# Patient Record
Sex: Female | Born: 1970 | Race: White | Hispanic: No | Smoking: Never smoker
Health system: Southern US, Community
[De-identification: ages and names within clinical notes are randomized; demographics above are authoritative.]

## PROBLEM LIST (undated history)

## (undated) DIAGNOSIS — I10 Essential (primary) hypertension: Secondary | ICD-10-CM

## (undated) DIAGNOSIS — C801 Malignant (primary) neoplasm, unspecified: Secondary | ICD-10-CM

## (undated) DIAGNOSIS — R569 Unspecified convulsions: Secondary | ICD-10-CM

## (undated) DIAGNOSIS — F419 Anxiety disorder, unspecified: Secondary | ICD-10-CM

## (undated) DIAGNOSIS — K259 Gastric ulcer, unspecified as acute or chronic, without hemorrhage or perforation: Secondary | ICD-10-CM

## (undated) DIAGNOSIS — M1990 Unspecified osteoarthritis, unspecified site: Secondary | ICD-10-CM

## (undated) DIAGNOSIS — F32A Depression, unspecified: Secondary | ICD-10-CM

## (undated) DIAGNOSIS — F329 Major depressive disorder, single episode, unspecified: Secondary | ICD-10-CM

## (undated) HISTORY — PX: CHOLECYSTECTOMY: SHX55

## (undated) HISTORY — PX: KNEE SURGERY: SHX244

## (undated) HISTORY — PX: EYE SURGERY: SHX253

---

## 2001-04-17 ENCOUNTER — Other Ambulatory Visit: Admission: RE | Admit: 2001-04-17 | Discharge: 2001-04-17 | Payer: Self-pay | Admitting: Obstetrics and Gynecology

## 2001-11-02 ENCOUNTER — Emergency Department (HOSPITAL_COMMUNITY): Admission: EM | Admit: 2001-11-02 | Discharge: 2001-11-02 | Payer: Self-pay | Admitting: Emergency Medicine

## 2004-07-03 ENCOUNTER — Emergency Department (HOSPITAL_COMMUNITY): Admission: EM | Admit: 2004-07-03 | Discharge: 2004-07-03 | Payer: Self-pay | Admitting: Emergency Medicine

## 2005-06-14 ENCOUNTER — Observation Stay (HOSPITAL_COMMUNITY): Admission: EM | Admit: 2005-06-14 | Discharge: 2005-06-15 | Payer: Self-pay | Admitting: Emergency Medicine

## 2005-07-20 ENCOUNTER — Ambulatory Visit (HOSPITAL_COMMUNITY): Admission: RE | Admit: 2005-07-20 | Discharge: 2005-07-20 | Payer: Self-pay | Admitting: Obstetrics and Gynecology

## 2006-12-05 ENCOUNTER — Other Ambulatory Visit: Admission: RE | Admit: 2006-12-05 | Discharge: 2006-12-05 | Payer: Self-pay | Admitting: Unknown Physician Specialty

## 2006-12-05 ENCOUNTER — Encounter (INDEPENDENT_AMBULATORY_CARE_PROVIDER_SITE_OTHER): Payer: Self-pay | Admitting: *Deleted

## 2007-07-03 ENCOUNTER — Other Ambulatory Visit: Admission: RE | Admit: 2007-07-03 | Discharge: 2007-07-03 | Payer: Self-pay | Admitting: Unknown Physician Specialty

## 2007-07-03 ENCOUNTER — Encounter (INDEPENDENT_AMBULATORY_CARE_PROVIDER_SITE_OTHER): Payer: Self-pay | Admitting: Unknown Physician Specialty

## 2007-09-23 ENCOUNTER — Emergency Department (HOSPITAL_COMMUNITY): Admission: EM | Admit: 2007-09-23 | Discharge: 2007-09-23 | Payer: Self-pay | Admitting: Emergency Medicine

## 2007-09-25 ENCOUNTER — Ambulatory Visit (HOSPITAL_COMMUNITY): Admission: RE | Admit: 2007-09-25 | Discharge: 2007-09-25 | Payer: Self-pay | Admitting: Family Medicine

## 2007-10-10 ENCOUNTER — Encounter (HOSPITAL_COMMUNITY): Admission: RE | Admit: 2007-10-10 | Discharge: 2007-11-09 | Payer: Self-pay | Admitting: Family Medicine

## 2007-10-22 ENCOUNTER — Ambulatory Visit (HOSPITAL_COMMUNITY): Admission: RE | Admit: 2007-10-22 | Discharge: 2007-10-22 | Payer: Self-pay | Admitting: General Surgery

## 2007-10-22 ENCOUNTER — Encounter (INDEPENDENT_AMBULATORY_CARE_PROVIDER_SITE_OTHER): Payer: Self-pay | Admitting: General Surgery

## 2008-09-01 ENCOUNTER — Emergency Department (HOSPITAL_COMMUNITY): Admission: EM | Admit: 2008-09-01 | Discharge: 2008-09-01 | Payer: Self-pay | Admitting: Emergency Medicine

## 2008-12-16 ENCOUNTER — Ambulatory Visit (HOSPITAL_COMMUNITY): Admission: RE | Admit: 2008-12-16 | Discharge: 2008-12-16 | Payer: Self-pay | Admitting: Family Medicine

## 2009-03-18 ENCOUNTER — Ambulatory Visit (HOSPITAL_COMMUNITY): Admission: RE | Admit: 2009-03-18 | Discharge: 2009-03-18 | Payer: Self-pay | Admitting: Family Medicine

## 2010-03-15 ENCOUNTER — Other Ambulatory Visit: Admission: RE | Admit: 2010-03-15 | Discharge: 2010-03-15 | Payer: Self-pay | Admitting: Family Medicine

## 2010-05-13 ENCOUNTER — Emergency Department (HOSPITAL_COMMUNITY): Admission: EM | Admit: 2010-05-13 | Discharge: 2010-05-13 | Payer: Self-pay | Admitting: Emergency Medicine

## 2010-05-26 ENCOUNTER — Encounter
Admission: RE | Admit: 2010-05-26 | Discharge: 2010-08-24 | Payer: Self-pay | Source: Home / Self Care | Admitting: Orthopaedic Surgery

## 2010-10-25 ENCOUNTER — Encounter: Payer: Self-pay | Admitting: Family Medicine

## 2011-02-15 NOTE — Op Note (Signed)
[name], [name] NO.:  [id number]   MEDICAL RECORD NO.:  [id number]          PATIENT TYPE:  AMB   LOCATION:  DAY                           FACILITY:  [facility]   PHYSICIAN:  Tilford Pillar, MD      DATE OF BIRTH:  [birth year]   DATE OF PROCEDURE:  10/22/2007  DATE OF DISCHARGE:                               OPERATIVE REPORT   PREOPERATIVE DIAGNOSES:  Cholelithiasis and biliary dyskinesia.   POSTOPERATIVE DIAGNOSES:  Cholelithiasis and biliary dyskinesia.   OPERATIVE PROCEDURE:  Laparoscopic cholecystectomy.   SURGEON:  Tilford Pillar, MD   ANESTHESIA:  General endotracheal anesthesia.  Local anesthetic 1%  Sensorcaine plain.   ESTIMATED BLOOD LOSS:  Less than 100 mL.   SPECIMENS:  Gallbladder.   COMPLICATIONS:  None.   INDICATIONS FOR PROCEDURE:  The patient is a 40 year old female with a  history of epigastric abdominal pain and intermittent bloating  discomfort.  She had been evaluated for actually a left sided pain by  her primary physician which demonstrated suspected UTI but during the  work-up she was also diagnosed with cholelithiasis.  HIDA scan was then  obtained by her primary physician which again demonstrated biliary  dyskinesia.  Based on these findings it was recommended that she would  require a cholecystectomy at some point, sooner rather than later was  anticipated.  The risks, benefits and alternatives of laparoscopic,  possible open cholecystectomy were discussed at length with the patient  including but not limited to the possibility of infection, bleeding,  common bile duct injury, bile leak or small bowel injury as well as the  possibility of intraoperative cardiac or pulmonary episodes.  The  patient's questions and concerns were addressed and the patient was  consented for the planned procedure.   DESCRIPTION OF PROCEDURE:  The patient was taken to the operating room,  placed in a supine position on the operating room table.   General  anesthesia was administered.  Once the patient was asleep, she was  endotracheally intubated by anesthesia.  At this point her abdomen was  prepped and draped in the usual fashion.  A stab incision was created  supraumbilically.  A Kocher clamp was utilized to dissect down to the  anterior abdominal wall fascia, grasped anteriorly, Veress needle was  inserted.  Saline drop test was utilized to confirm anterior peritoneal  placement and a pneumoperitoneum was initiated.  Once sufficient  pneumoperitoneum was obtained, an 11 mm trocar was inserted over a  laparoscope, allowing visualization of the trocar, entering into the  peritoneal cavity.  At this point the inner cannula was removed.  The  laparoscope was reinserted.  There was no evidence of any trocar or  Veress needle placement injury.  At this time there was a thin veil of  adhesions superior blocking view of the right lobe of the liver and this  was in continuity with the falciform ligament.  Good visualization of  the left upper quadrant was possible at this point as there was an area  of very thin adhesions.  The laparoscope was utilized to bluntly dissect  through this, allowing exposure into the right upper quadrant.  At this  point the remaining trocars were placed with an 11 mm trocar in the  epigastrium, a 5 mm trocar in the midline between the two 11 mm trocars  and a 5 mm trocar in the right lateral abdominal wall.  At this point  the laparoscope was reinserted through the epigastric trocar site to  visualize the previously placed umbilical trocar.  This could easily be  visualized.  No evidence of any bowel with any adhesions was noted.  No  evidence of any significant injury or bleeding were noted.  At this time  the laparoscope was reinserted back into the umbilical trocar site and  at this time the patient was placed in the reverse Trendelenburg left  lateral decubitus position for exposure.  The fundus of the  gallbladder  was grasped with the regular grasper, lifted up and over the liver.  At  this point a combination of electrocautery and blunt dissection was  utilized to strip the peritoneal reflection off the infundibulum of the  gallbladder.  This allowed exposure of the cystic duct.  A window was  created behind the cystic duct.  Three endoclips were placed proximally,  one distally and the cystic duct was divided between the two most distal  clips.  Similarly the cystic artery was identified. A window was created  behind this with the [facility] and two endoclips were placed  proximally, one distally and the cystic artery was divided between the  two most distal clips.  At this point electrocautery was utilized to  dissect the gallbladder free from the gallbladder fossa.  Once free it  was placed in the EndoCatch bag which was placed up and over the right  lobe of the liver.  At this point hemostasis was obtained using  electrocautery in the gallbladder fossa and a piece of Surgicel was  placed into the fossa.  At this point the patient was placed back into a  supine position.  Using an endoclose suture passing device to pass a 2-0  Vicryl to the epigastric trocar site.  With this suture in place,  attention was turned back to the umbilical trocar site.  Due to the  amount of adhesions it was not felt to be safe to use the endoclose  suture device to attempt placement.  Therefore at this point the  gallbladder was removed using an intact EndoCatch bag from the  epigastric trocar site.  Some blunt dilatation of the trocar site was  required in order to adequately remove the gallbladder.  The gallbladder  was then placed on the back table and was sent as permanent specimen to  pathology.  At this point the pneumoperitoneum was evacuated.  The local  anesthetic was instilled.  An 0 Vicryl and a UR needle was utilized to  close the fascia at the umbilical trocar site and then a 4-0  Monocryl  was utilized to reapproximate the skin edges at all four trocar sites.  Skin was washed and dried with a moist dry towel.  Benzoin as applied to  all trocar sites.  Half inch Steri-Strips were placed over the incisions  and drapes were removed.  The patient was allowed to come out of general  anesthetic, transferred back to her hospital bed.  She was transferred  to the post anesthetic care unit in stable condition.  At the conclusion  of the procedure all instruments, sponge and needle counts were  correct.  The patient tolerated the procedure well.      Tilford Pillar, MD  Electronically Signed     BZ/MEDQ  D:  10/22/2007  T:  10/22/2007  Job:  [id number]   cc:   Tilford Pillar, MD  Fax: [phone]   Dr. Metta Clines
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
## 2011-02-18 NOTE — H&P (Signed)
[name], [name] NO.:  [id number]   MEDICAL RECORD NO.:  [id number]          PATIENT TYPE:  EMS   LOCATION:  ED                            FACILITY:  [facility]   PHYSICIAN:  Kingsley Callander. Ouida Sills, MD       DATE OF BIRTH:  [birth year]   DATE OF ADMISSION:  06/14/2005  DATE OF DISCHARGE:  LH                                HISTORY & PHYSICAL   CHIEF COMPLAINT:  Seizure.   HISTORY OF PRESENT ILLNESS:  This patient is a 40 year old white female who  presented to the emergency room by ambulance after suffering what was felt  to have been a seizure at home.  The patient had been working around her  home.  She had been feeling in her usual state of health.  Her boyfriend  witnessed her to stare off and point and then witnessed her fall and begin  experiencing convulsions.  She was unresponsive.  She had generalized  jerking type movements.  She bit her tongue.  She did not experience  incontinence.  There has been no previous seizure history.  She does have a  family history of seizures in her sister and her mother.  She denies any  prior head trauma, alcohol abuse, or change in medications.   PAST MEDICAL HISTORY:  1.  Detached retina 11 years ago.  2.  Migraine headaches.  3  Abortion 2 months ago.   MEDICATIONS:  1.  Prozac 40 mg daily.  2.  Darvocet p.r.n.  3.  Xanax 1 mg p.r.n. (on average about 15 per month).  4.  Depo-Provera.   ALLERGIES:  None.   SOCIAL HISTORY:  She does not smoke or use drugs.  She does not abuse  alcohol. She works at [employer].   FAMILY HISTORY:  As above.   REVIEW OF SYSTEMS:  She had experienced a mild right-sided headache prior to  this event.  No vomiting, change in bowel habits, chest pain, or difficulty  breathing.   PHYSICAL EXAMINATION:  VITAL SIGNS:  Temperature 98.4, blood pressure  114/66, pulse 80, respirations 22, oxygen saturation 100%.  GENERAL:  Alert and oriented.  HEENT:  She has abrasions on the right side of her face  an head.  She has  periorbital swelling and bruising on the right.  Pupils are equal, round,  and reactive.  Extraocular movements are intact.  Pharynx is moist.  She has  a small area of swelling and superficial laceration on the left side of the  tongue.  NECK:  Supple.  No thyromegaly or lymphadenopathy.  LUNGS:  Clear.  HEART:  Regular with no murmurs.  ABDOMEN:  Nontender.  No hepatosplenomegaly.  EXTREMITIES:  She has superficial abrasions on her right shoulder and right  knee.  No clubbing, cyanosis, or edema.  NEUROLOGIC:  She is alert and oriented.  Her speech is normal.  Her face is  symmetric. Her strength is normal in the upper and lower extremities.  Her  gait has been normal.   LABORATORY DATA:  White count 4.1, hemoglobin 12.3, platelets 257,000.  Urine pregnancy  test is negative.  Sodium 137, potassium 3.0, chloride 103,  bicarb 25, glucose 95, BUN 7, creatinine 0.6, calcium 8.5, albumin 3.7, SGOT  20.  A drug screen was positive for benzodiazepines only.  Her urinalysis  revealed 11-20 red cells and 3-6 white cells.  A CT scan of the head was  negative.  A maxillofacial CT scan revealed soft tissue swelling in the area  of the right globe, but no fracture.   IMPRESSION:  1.  New onset of seizure disorder.  She is being hospitalized for      observation.  Will consult Dr. Gerilyn Pilgrim; and will obtain an EEG.  Will      defer the decision regarding anticonvulsant therapy for now, but she      will be treated with Ativan if needed.  2.  Hypokalemia.  We will supplement orally.  3.  Possible urinary tract infection.  We will obtain a urine culture.  4.  History of anxiety/depression continue Prozac and Xanax.  5.  History of migraines and detached retina.      Kingsley Callander. Ouida Sills, MD  Electronically Signed     ROF/MEDQ  D:  06/14/2005  T:  06/14/2005  Job:  [id number]   cc:   Mila Homer. Sudie Bailey, M.D.  [address] [place], [place] [address]  Fax: [phone]
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
## 2011-02-18 NOTE — Discharge Summary (Signed)
Theresa, Benson NO.:  [id number]   MEDICAL RECORD NO.:  [id number]          PATIENT TYPE:  OBV   LOCATION:  A219                          FACILITY:  [facility]   PHYSICIAN:  Mila Homer. Sudie Bailey, M.D.DATE OF BIRTH:  25-Jun-1971   DATE OF ADMISSION:  06/14/2005  DATE OF DISCHARGE:  09/13/2006LH                                 DISCHARGE SUMMARY   HISTORY OF PRESENT ILLNESS:  This 40 year old was admitted to the hospital  last night after what appeared to be a seizure.  She had a benign course  overnight into the early afternoon.  She had no further seizures.   Blood work was essentially unremarkable except for some sign of possible  urinary tract infection with increased white cells in the urine and a  potassium and a met 7 of 3.0.   Her examination was unremarkable except for some abrasions and bruising in  the right cheek.  Eye exam appeared to be normal as was the oral cavity  except for a chipped lower tooth.   PLAN:  EEG was to be done in the hospital, but was unavailable.  Dr. Tenny Craw  could only see her later, and since she was stable without further problems,  was elected to discharge her home to be seen by the neurologist, Dr. Beryle Beams, at 9:40 on June 16, 2005, in his office in [place].  He was to  arrange EEG for her at that time.  She was also given a script for K-Ciel 20  mEq daily (#30, no refills) and fluoxetine higher dose than she has been on,  40 mg daily, #30, 2 refills.   FINAL DISCHARGE DIAGNOSES:  1.  Possible epilepsy.  2.  Hypokalemia.  3.  Depression.      Mila Homer. Sudie Bailey, M.D.  Electronically Signed     SDK/MEDQ  D:  06/15/2005  T:  06/16/2005  Job:  [id number]   cc:   Darleen Crocker A. Gerilyn Pilgrim, M.D.  Fax: [phone]

## 2011-06-23 LAB — BASIC METABOLIC PANEL
BUN: 9
CO2: 22
Calcium: 8.9
Chloride: 108
Creatinine, Ser: 0.71
GFR calc Af Amer: >60
GFR calc non Af Amer: >60
Glucose, Bld: 129 — ABNORMAL HIGH
Potassium: 3.8
Sodium: 136

## 2011-06-23 LAB — CBC
HCT: 39.4
Hemoglobin: 13.2
MCHC: 33.5
MCV: 87.3
Platelets: 298
RBC: 4.51
RDW: 14.1
WBC: 6.3

## 2011-06-23 LAB — HCG, QUANTITATIVE, PREGNANCY: hCG, Beta Chain, Quant, S: <2

## 2011-07-05 LAB — PREGNANCY, URINE: Preg Test, Ur: NEGATIVE

## 2011-07-08 LAB — URINALYSIS, ROUTINE W REFLEX MICROSCOPIC
Glucose, UA: NEGATIVE
Ketones, ur: NEGATIVE
Nitrite: POSITIVE — AB
Protein, ur: 100 — AB
Specific Gravity, Urine: 1.025
Urobilinogen, UA: 2 — ABNORMAL HIGH
pH: 6

## 2011-07-08 LAB — URINE MICROSCOPIC-ADD ON

## 2011-07-08 LAB — URINE CULTURE: Colony Count: >=100000

## 2011-07-08 LAB — GC/CHLAMYDIA PROBE AMP, GENITAL
Chlamydia, DNA Probe: NEGATIVE
GC Probe Amp, Genital: NEGATIVE

## 2011-07-08 LAB — WET PREP, GENITAL
Clue Cells Wet Prep HPF POC: NONE SEEN
Trich, Wet Prep: NONE SEEN
Yeast Wet Prep HPF POC: NONE SEEN

## 2011-07-08 LAB — PREGNANCY, URINE: Preg Test, Ur: NEGATIVE

## 2011-08-05 ENCOUNTER — Emergency Department (HOSPITAL_COMMUNITY)
Admission: EM | Admit: 2011-08-05 | Discharge: 2011-08-05 | Disposition: A | Discharge status: Home / Self Care | Payer: Self-pay | Attending: Emergency Medicine | Admitting: Emergency Medicine

## 2011-08-05 ENCOUNTER — Encounter: Payer: Self-pay | Admitting: *Deleted

## 2011-08-05 DIAGNOSIS — J329 Chronic sinusitis, unspecified: Secondary | ICD-10-CM | POA: Insufficient documentation

## 2011-08-05 DIAGNOSIS — J40 Bronchitis, not specified as acute or chronic: Secondary | ICD-10-CM | POA: Insufficient documentation

## 2011-08-05 MED ORDER — PROMETHAZINE-CODEINE 6.25-10 MG/5ML PO SYRP: 5.0000 mL | ORAL_SOLUTION | Freq: Four times a day (QID) | ORAL | Status: AC | PRN

## 2011-08-05 MED ORDER — PREDNISONE 20 MG PO TABS
60.0000 mg | ORAL_TABLET | Freq: Once | ORAL | Status: AC
  Administered 2011-08-05: 60 mg via ORAL
  Filled 2011-08-05: qty 3

## 2011-08-05 MED ORDER — PREDNISONE 10 MG PO TABS: ORAL_TABLET | ORAL | Status: DC

## 2011-08-05 MED ORDER — HYDROCOD POLST-CHLORPHEN POLST 10-8 MG/5ML PO LQCR
5.0000 mL | Freq: Once | ORAL | Status: AC
  Administered 2011-08-05: 5 mL via ORAL
  Filled 2011-08-05: qty 5

## 2011-08-05 MED ORDER — PENICILLIN V POTASSIUM 500 MG PO TABS: ORAL_TABLET | ORAL | Status: DC

## 2011-08-05 MED ORDER — PENICILLIN V POTASSIUM 250 MG PO TABS
500.0000 mg | ORAL_TABLET | Freq: Once | ORAL | Status: AC
  Administered 2011-08-05: 500 mg via ORAL
  Filled 2011-08-05: qty 2

## 2011-08-05 MED ORDER — FEXOFENADINE-PSEUDOEPHED ER 60-120 MG PO TB12: 1.0000 | ORAL_TABLET | Freq: Two times a day (BID) | ORAL | Status: DC

## 2011-08-05 NOTE — ED Provider Notes (Signed)
History     CSN: [id number] Arrival date & time: 08/05/2011 11:11 AM   First MD Initiated Contact with Patient 08/05/11 1216      Chief Complaint  Patient presents with  . Cough  . Nasal Congestion  . Sore Throat  . Generalized Body Aches    (Consider location/radiation/quality/duration/timing/severity/associated sxs/prior treatment) Patient is a 40 y.o. female presenting with cough and pharyngitis. The history is provided by the patient.  Cough This is a new problem. The current episode started 2 days ago. The problem occurs every few minutes. The problem has not changed since onset.The cough is non-productive. The maximum temperature recorded prior to her arrival was 100 to 100.9 F. Associated symptoms include chills, sweats, headaches, rhinorrhea, sore throat and myalgias. Pertinent negatives include no chest pain, no shortness of breath and no wheezing. She has tried cough syrup for the symptoms. The treatment provided no relief. She is not a smoker. Her past medical history is significant for bronchitis.  Sore Throat Associated symptoms include chills, coughing, headaches, myalgias and a sore throat. Pertinent negatives include no abdominal pain, arthralgias, chest pain or neck pain.    History reviewed. No pertinent past medical history.  History reviewed. No pertinent past surgical history.  History reviewed. No pertinent family history.  History  Substance Use Topics  . Smoking status: Never Smoker   . Smokeless tobacco: Not on file  . Alcohol Use: No    OB History    Grav Para Term Preterm Abortions TAB SAB Ect Mult Living                  Review of Systems  Constitutional: Positive for chills. Negative for activity change.       All ROS Neg except as noted in HPI  HENT: Positive for sore throat and rhinorrhea. Negative for nosebleeds and neck pain.   Eyes: Negative for photophobia and discharge.  Respiratory: Positive for cough. Negative for shortness of  breath and wheezing.   Cardiovascular: Negative for chest pain and palpitations.  Gastrointestinal: Negative for abdominal pain and blood in stool.  Genitourinary: Negative for dysuria, frequency and hematuria.  Musculoskeletal: Positive for myalgias. Negative for back pain and arthralgias.  Skin: Negative.   Neurological: Positive for headaches. Negative for dizziness, seizures and speech difficulty.  Psychiatric/Behavioral: Negative for hallucinations and confusion.    Allergies  Review of patient's allergies indicates no known allergies.  Home Medications   Current Outpatient Rx  Name Route Sig Dispense Refill  . ARIPIPRAZOLE 15 MG PO TABS Oral Take 15 mg by mouth daily.      Marland Kitchen CLONAZEPAM 1 MG PO TABS Oral Take 1 mg by mouth 2 (two) times daily.      Marland Kitchen DEXTROMETHORPHAN-GUAIFENESIN 30-600 MG PO TB12 Oral Take 1 tablet by mouth every 12 (twelve) hours.      Marland Kitchen PAROXETINE HCL 30 MG PO TABS Oral Take 30 mg by mouth every morning.        BP 113/79  Pulse 97  Temp(Src) 99.2 F (37.3 C) (Oral)  Resp 18  Ht 5\' 6"  (1.676 m)  Wt 265 lb 8 oz (120.43 kg)  BMI 42.85 kg/m2  SpO2 100%  Physical Exam  Nursing note and vitals reviewed. Constitutional: She is oriented to person, place, and time. She appears well-developed and well-nourished.  Non-toxic appearance.  HENT:  Head: Normocephalic.  Right Ear: Tympanic membrane and external ear normal.  Left Ear: Tympanic membrane and external ear normal.  Nasal congestion noted. Mod increase redness of the posterior pharynx. No exudate. Airway patent  Eyes: EOM and lids are normal. Pupils are equal, round, and reactive to light.  Neck: Normal range of motion. Neck supple. Carotid bruit is not present.  Cardiovascular: Normal rate, regular rhythm, normal heart sounds, intact distal pulses and normal pulses.   Pulmonary/Chest: No respiratory distress. She has rhonchi.       Anterior chest wall soreness to palpation. Course breath sounds.    Abdominal: Soft. Bowel sounds are normal. There is no tenderness. There is no guarding.  Musculoskeletal: Normal range of motion.  Lymphadenopathy:       Head (right side): No submandibular adenopathy present.       Head (left side): No submandibular adenopathy present.    She has no cervical adenopathy.  Neurological: She is alert and oriented to person, place, and time. She has normal strength. No cranial nerve deficit or sensory deficit.  Skin: Skin is warm and dry.  Psychiatric: She has a normal mood and affect. Her speech is normal.    ED Course  Procedures (including critical care time)  Labs Reviewed - No data to display No results found.  Dx: 1. Sinusitis   2. Bronchitis   MDM  I have reviewed nursing notes, vital signs, and all appropriate lab and imaging results for this patient.        Kathie Dike, [place] 08/05/11 [phone]

## 2011-08-05 NOTE — ED Notes (Signed)
Pt a/ox4. Resp even and unlabored. NAD at this time. D/C instructions and rx x4 reviewed with pt. Pt verbalized understanding. Pt ambulated to POV with steady gate. Husband with pt to transport home.

## 2011-08-05 NOTE — ED Notes (Signed)
Pt medicated per orders.

## 2011-08-05 NOTE — ED Notes (Signed)
Pt c/o cough congestion sore throat and body aches that started 2 days ago.

## 2011-08-07 NOTE — ED Provider Notes (Signed)
Medical screening examination/treatment/procedure(s) were performed by non-physician practitioner and as supervising physician I was immediately available for consultation/collaboration.  Donnetta Hutching, MD 08/07/11 [phone]

## 2011-08-21 ENCOUNTER — Encounter (HOSPITAL_COMMUNITY): Payer: Self-pay

## 2011-08-21 ENCOUNTER — Emergency Department (HOSPITAL_COMMUNITY)
Admission: EM | Admit: 2011-08-21 | Discharge: 2011-08-21 | Disposition: A | Discharge status: Home / Self Care | Payer: Self-pay | Attending: Emergency Medicine | Admitting: Emergency Medicine

## 2011-08-21 ENCOUNTER — Emergency Department (HOSPITAL_COMMUNITY): Payer: Self-pay

## 2011-08-21 DIAGNOSIS — J40 Bronchitis, not specified as acute or chronic: Secondary | ICD-10-CM | POA: Insufficient documentation

## 2011-08-21 MED ORDER — GUAIFENESIN-CODEINE 100-10 MG/5ML PO SYRP: ORAL_SOLUTION | ORAL | Status: DC

## 2011-08-21 MED ORDER — HYDROCOD POLST-CHLORPHEN POLST 10-8 MG/5ML PO LQCR
5.0000 mL | Freq: Once | ORAL | Status: AC
  Administered 2011-08-21: 5 mL via ORAL
  Filled 2011-08-21: qty 5

## 2011-08-21 NOTE — ED Provider Notes (Signed)
History     CSN: [id number] Arrival date & time: 08/21/2011  7:36 PM   First MD Initiated Contact with Patient 08/21/11 1942      Chief Complaint  Patient presents with  . Bronchitis    (Consider location/radiation/quality/duration/timing/severity/associated sxs/prior treatment) HPI Comments: States she was seen here ~ 10 days ago with same sxs.  She has completed a 10 day regimen of antibiotic and is not better.  Tmax 99.9.  No other sxs.  Patient is a 40 y.o. female presenting with cough. The history is provided by the patient. No language interpreter was used.  Cough This is a new problem. Episode onset: more than 10 days. The problem occurs every few minutes. The cough is productive of sputum. The maximum temperature recorded prior to her arrival was 100 to 100.9 F. Pertinent negatives include no chest pain, no chills, no sweats, no ear congestion, no ear pain, no headaches, no sore throat, no shortness of breath, no wheezing and no eye redness. She is not a smoker. Her past medical history does not include bronchitis, pneumonia, COPD, emphysema or asthma.    History reviewed. No pertinent past medical history.  Past Surgical History  Procedure Date  . Cholecystectomy     No family history on file.  History  Substance Use Topics  . Smoking status: Never Smoker   . Smokeless tobacco: Not on file  . Alcohol Use: No    OB History    Grav Para Term Preterm Abortions TAB SAB Ect Mult Living                  Review of Systems  Constitutional: Positive for fever. Negative for chills.  HENT: Negative for ear pain and sore throat.   Eyes: Negative for redness.  Respiratory: Positive for cough. Negative for shortness of breath and wheezing.   Cardiovascular: Negative for chest pain.  Neurological: Negative for headaches.  All other systems reviewed and are negative.    Allergies  Review of patient's allergies indicates no known allergies.  Home Medications    Current Outpatient Rx  Name Route Sig Dispense Refill  . ARIPIPRAZOLE 15 MG PO TABS Oral Take 15 mg by mouth daily.      Marland Kitchen CLONAZEPAM 1 MG PO TABS Oral Take 1 mg by mouth 2 (two) times daily.      Marland Kitchen DEXTROMETHORPHAN-GUAIFENESIN 30-600 MG PO TB12 Oral Take 1 tablet by mouth every 12 (twelve) hours.      Marland Kitchen FEXOFENADINE-PSEUDOEPHEDRINE 60-120 MG PO TB12 Oral Take 1 tablet by mouth every 12 (twelve) hours. 20 tablet 0  . PAROXETINE HCL 30 MG PO TABS Oral Take 30 mg by mouth every morning.      Marland Kitchen PENICILLIN V POTASSIUM 500 MG PO TABS  2 po bid with food 28 tablet 0  . PREDNISONE 10 MG PO TABS  6,5,4,3,2,1 - take with food 21 tablet 0    BP 117/92  Pulse 106  Temp(Src) 98.1 F (36.7 C) (Oral)  Resp 20  Ht 5\' 6"  (1.676 m)  Wt 245 lb (111.131 kg)  BMI 39.54 kg/m2  SpO2 98%  Physical Exam  Nursing note and vitals reviewed. Constitutional: She is oriented to person, place, and time. She appears well-developed and well-nourished. No distress.  HENT:  Head: Normocephalic and atraumatic.  Eyes: EOM are normal.  Neck: Normal range of motion.  Cardiovascular: Normal rate, regular rhythm and normal heart sounds.   Pulmonary/Chest: Effort normal and breath sounds normal. No accessory  muscle usage. Not tachypneic. No respiratory distress. She has no decreased breath sounds. She has no wheezes. She has no rales. She exhibits no tenderness.       Pt has a coarse sounding cough.  Abdominal: Soft. She exhibits no distension. There is no tenderness.  Musculoskeletal: Normal range of motion.  Neurological: She is alert and oriented to person, place, and time.  Skin: Skin is warm and dry.  Psychiatric: She has a normal mood and affect. Judgment normal.    ED Course  Procedures (including critical care time)  Labs Reviewed - No data to display Dg Chest 2 View  08/21/2011  *RADIOLOGY REPORT*  Clinical Data: Cough and congestion.  Fever.  CHEST - 2 VIEW  Comparison: 09/24/2005  Findings:  Prominent epicardial adipose tissue is again noted.  The heart size is within normal limits.  The lungs appear clear.  No pleural effusion is observed.  IMPRESSION:  1.  No significant abnormality identified.  Original Report Authenticated By: Dellia Cloud, M.D.     No diagnosis found.    MDM          Worthy Rancher, PA 08/21/11 2028

## 2011-08-21 NOTE — ED Notes (Signed)
Pt presents with bronchitis. Pt states she was seen here 12 days ago and has finished all meds and is not getting better.

## 2011-08-21 NOTE — ED Notes (Signed)
Pt states has had cough x 15 days with worsening symptoms.  Pt states coughs greenish mucus at times. Denies fever, N/V.  Chest x-ray results pending.

## 2011-08-22 NOTE — ED Provider Notes (Signed)
Medical screening examination/treatment/procedure(s) were performed by non-physician practitioner and as supervising physician I was immediately available for consultation/collaboration. Devoria Albe, MD, Armando Gang   Ward Givens, MD 08/22/11 [phone]

## 2012-07-04 ENCOUNTER — Emergency Department (HOSPITAL_COMMUNITY)
Admission: EM | Admit: 2012-07-04 | Discharge: 2012-07-05 | Disposition: A | Discharge status: Home / Self Care | Payer: Self-pay | Attending: Emergency Medicine | Admitting: Emergency Medicine

## 2012-07-04 ENCOUNTER — Encounter (HOSPITAL_COMMUNITY): Payer: Self-pay | Admitting: Emergency Medicine

## 2012-07-04 ENCOUNTER — Emergency Department (HOSPITAL_COMMUNITY): Payer: Self-pay

## 2012-07-04 DIAGNOSIS — R110 Nausea: Secondary | ICD-10-CM | POA: Insufficient documentation

## 2012-07-04 DIAGNOSIS — R0602 Shortness of breath: Secondary | ICD-10-CM | POA: Insufficient documentation

## 2012-07-04 DIAGNOSIS — R079 Chest pain, unspecified: Secondary | ICD-10-CM | POA: Insufficient documentation

## 2012-07-04 DIAGNOSIS — M549 Dorsalgia, unspecified: Secondary | ICD-10-CM

## 2012-07-04 HISTORY — DX: Depression, unspecified: F32.A

## 2012-07-04 HISTORY — DX: Unspecified osteoarthritis, unspecified site: M19.90

## 2012-07-04 HISTORY — DX: Anxiety disorder, unspecified: F41.9

## 2012-07-04 HISTORY — DX: Major depressive disorder, single episode, unspecified: F32.9

## 2012-07-04 LAB — TROPONIN I: Troponin I: <0.3 ng/mL (ref <0.30)

## 2012-07-04 LAB — CBC WITH DIFFERENTIAL/PLATELET
Basophils Absolute: 0 10*3/uL (ref 0.0–0.1)
Basophils Relative: 0 % (ref 0–1)
Hemoglobin: 13.5 g/dL (ref 12.0–15.0)
Lymphocytes Relative: 33 % (ref 12–46)
MCHC: 33.2 g/dL (ref 30.0–36.0)
Monocytes Relative: 6 % (ref 3–12)
Neutro Abs: 6.1 10*3/uL (ref 1.7–7.7)
Neutrophils Relative %: 58 % (ref 43–77)
RDW: 13.1 % (ref 11.5–15.5)
WBC: 10.5 10*3/uL (ref 4.0–10.5)

## 2012-07-04 LAB — BASIC METABOLIC PANEL
Chloride: 101 mEq/L (ref 96–112)
GFR calc Af Amer: >90 mL/min (ref >90)
Potassium: 3.4 mEq/L — ABNORMAL LOW (ref 3.5–5.1)

## 2012-07-04 MED ORDER — SODIUM CHLORIDE 0.9 % IV BOLUS (SEPSIS)
250.0000 mL | Freq: Once | INTRAVENOUS | Status: AC
  Administered 2012-07-04: 250 mL via INTRAVENOUS

## 2012-07-04 MED ORDER — ONDANSETRON HCL 4 MG/2ML IJ SOLN
4.0000 mg | Freq: Once | INTRAMUSCULAR | Status: AC
  Administered 2012-07-04: 4 mg via INTRAVENOUS
  Filled 2012-07-04: qty 2

## 2012-07-04 MED ORDER — HYDROMORPHONE HCL PF 1 MG/ML IJ SOLN
1.0000 mg | Freq: Once | INTRAMUSCULAR | Status: AC
  Administered 2012-07-04: 1 mg via INTRAVENOUS
  Filled 2012-07-04: qty 1

## 2012-07-04 MED ORDER — SODIUM CHLORIDE 0.9 % IV SOLN
INTRAVENOUS | Status: DC
  Administered 2012-07-05: 1000 mL via INTRAVENOUS

## 2012-07-04 NOTE — ED Notes (Signed)
Patient states she started having pain between shoulder blades that radiated into her chest and right arm approximately 1 hour ago.

## 2012-07-04 NOTE — ED Provider Notes (Signed)
History  This chart was scribed for Theresa Jakes, MD by Bennett Scrape. This patient was seen in room [facility] and the patient's care was started at 9:44PM.  CSN: [id number]  Arrival date & time 07/04/12  1953   First MD Initiated Contact with Patient 07/04/12 2144      Chief Complaint  Patient presents with  . Chest Pain     Patient is a 41 y.o. female presenting with back pain. The history is provided by the patient. No language interpreter was used.  Back Pain  This is a new problem. The current episode started 1 to 2 hours ago. The problem occurs constantly. The problem has not changed since onset.The pain is associated with no known injury. The pain is present in the thoracic spine. The pain is at a severity of 7/10. Associated symptoms include chest pain (radiation from back) and headaches. Pertinent negatives include no fever, no abdominal pain, no dysuria and no weakness.    [name] is a 41 y.o. female who presents to the Emergency Department complaining of one hour of back pain located between the shoulder blades described as sharp that radiates to the CP with associated mild SOB and nausea. She denies feeling SOB currently. The pain is worse with deep breathing and movement.  She rates her back pain a 7 out of 10 and CP a 5 out of 10. She reports that she took one ASA PTA with mild improvement in her symptoms. She denies having prior episodes of similar symptoms. She denies having a h/o heart related conditions or having a family h/o heart related conditions. She also c/o mild HA that is similar to prior HAs that she has experienced. She denies fever, neck pain, sore throat, visual disturbance, cough, abdominal pain, emesis, diarrhea, urinary symptoms, weakness, numbness and rash as associated symptoms. She has h/o anxiety, depression and arthritis. She denies smoking and alcohol use.   PCP is Dr. Sudie Bailey.   Past Medical History  Diagnosis Date  . Anxiety   .  Depression   . Arthritis     Past Surgical History  Procedure Date  . Cholecystectomy     History reviewed. No pertinent family history.  History  Substance Use Topics  . Smoking status: Never Smoker   . Smokeless tobacco: Not on file  . Alcohol Use: No    No OB history provided.  Review of Systems  Constitutional: Negative for fever and chills.  HENT: Negative for congestion and sore throat.   Eyes: Negative for visual disturbance.  Respiratory: Positive for shortness of breath. Negative for cough.   Cardiovascular: Positive for chest pain (radiation from back).  Gastrointestinal: Positive for nausea. Negative for vomiting, abdominal pain and diarrhea.  Genitourinary: Negative for dysuria and frequency.  Musculoskeletal: Positive for back pain.  Skin: Negative for rash.  Neurological: Positive for headaches. Negative for weakness.    Allergies  Naproxen  Home Medications   Current Outpatient Rx  Name Route Sig Dispense Refill  . ASPIRIN EC 81 MG PO TBEC Oral Take 81 mg by mouth once as needed. FOR PAIN    . CLONAZEPAM 1 MG PO TABS Oral Take 1 mg by mouth 3 (three) times daily as needed. For anxiety    . ETONOGESTREL 68 MG [place] IMPL Subcutaneous Inject 1 each into the skin once.    Marland Kitchen HYDROCODONE-ACETAMINOPHEN 10-325 MG PO TABS Oral Take 1 tablet by mouth every 6 (six) hours as needed. For pain    .  MUSCLE RUB 10-15 % EX CREA Topical Apply 1 application topically once as needed. FOR PAIN    . ADULT MULTIVITAMIN W/MINERALS CH Oral Take 1 tablet by mouth daily.    Marland Kitchen PAROXETINE HCL 20 MG PO TABS Oral Take 20 mg by mouth daily.    Marland Kitchen RISPERIDONE 1 MG PO TABS Oral Take 1 mg by mouth daily.    . TRAZODONE HCL 100 MG PO TABS Oral Take 300 mg by mouth at bedtime.      Triage Vitals: BP 116/83  Pulse 86  Temp 97.5 F (36.4 C) (Oral)  Resp 22  Ht 5\' 6"  (1.676 m)  Wt 274 lb (124.286 kg)  BMI 44.22 kg/m2  SpO2 97%  Physical Exam  Nursing note and vitals  reviewed. Constitutional: She is oriented to person, place, and time. She appears well-developed and well-nourished. No distress.  HENT:  Head: Normocephalic and atraumatic.  Eyes: Conjunctivae normal and EOM are normal.  Neck: Neck supple. No tracheal deviation present.  Cardiovascular: Normal rate and regular rhythm.   No murmur heard. Pulmonary/Chest: Effort normal and breath sounds normal. No respiratory distress.  Abdominal: Soft. Bowel sounds are normal. There is no tenderness.  Musculoskeletal: Normal range of motion. She exhibits no edema (no ankle swelling) and no tenderness (no calf tenderness).       Able to move both sets of fingers and toes  Neurological: She is alert and oriented to person, place, and time.  Skin: Skin is warm and dry.  Psychiatric: She has a normal mood and affect. Her behavior is normal.    ED Course  Procedures (including critical care time)  DIAGNOSTIC STUDIES: Oxygen Saturation is 97% on room air, adequate by my interpretation.    COORDINATION OF CARE: 10:21PM-Discussed treatment plan which includes a CT chest with pt at bedside and pt agreed to plan.  10:31PM-Ordered CXR, CBC, metabolic panel and troponin.  10:45PM-Ordered 250 mL bolus, 4 mg Zofran and 1 mg Dilaudid.   Labs Reviewed  BASIC METABOLIC PANEL - Abnormal; Notable for the following:    Potassium 3.4 (*)     All other components within normal limits  CBC WITH DIFFERENTIAL  TROPONIN I   Dg Chest 2 View  07/04/2012  *RADIOLOGY REPORT*  Clinical Data: Chest pain  CHEST - 2 VIEW  Comparison: August 21, 2011  Findings: There is minimal scarring in the left base which is stable.  The lungs are otherwise clear.  The heart size and pulmonary vascularity are normal.  No adenopathy.  No bone lesions.  IMPRESSION: No edema or consolidation.   Original Report Authenticated By: Arvin Collard. WOODRUFF III, M.D.      Date: 07/04/2012  Rate: 77  Rhythm: normal sinus rhythm and sinus  arrhythmia  QRS Axis: normal  Intervals: normal  ST/T Wave abnormalities: normal  Conduction Disutrbances:none  Narrative Interpretation:   Old EKG Reviewed: none available  Results for orders placed during the hospital encounter of 07/04/12  CBC WITH DIFFERENTIAL      Component Value Range   WBC 10.5  4.0 - 10.5 K/uL   RBC 4.55  3.87 - 5.11 MIL/uL   Hemoglobin 13.5  12.0 - 15.0 g/dL   HCT 16.1  09.6 - 04.5 %   MCV 89.5  78.0 - 100.0 fL   MCH 29.7  26.0 - 34.0 pg   MCHC 33.2  30.0 - 36.0 g/dL   RDW 40.9  81.1 - 91.4 %   Platelets 293  150 -  400 K/uL   Neutrophils Relative 58  43 - 77 %   Neutro Abs 6.1  1.7 - 7.7 K/uL   Lymphocytes Relative 33  12 - 46 %   Lymphs Abs 3.5  0.7 - 4.0 K/uL   Monocytes Relative 6  3 - 12 %   Monocytes Absolute 0.6  0.1 - 1.0 K/uL   Eosinophils Relative 2  0 - 5 %   Eosinophils Absolute 0.2  0.0 - 0.7 K/uL   Basophils Relative 0  0 - 1 %   Basophils Absolute 0.0  0.0 - 0.1 K/uL  BASIC METABOLIC PANEL      Component Value Range   Sodium 136  135 - 145 mEq/L   Potassium 3.4 (*) 3.5 - 5.1 mEq/L   Chloride 101  96 - 112 mEq/L   CO2 26  19 - 32 mEq/L   Glucose, Bld 93  70 - 99 mg/dL   BUN 10  6 - 23 mg/dL   Creatinine, Ser 1.61  0.50 - 1.10 mg/dL   Calcium 9.4  8.4 - 09.6 mg/dL   GFR calc non Af Amer >90  >90 mL/min   GFR calc Af Amer >90  >90 mL/min  TROPONIN I      Component Value Range   Troponin I <0.30  <0.30 ng/mL      1. Chest pain   2. Back pain   3. Shortness of breath       MDM   patient'shistory of pain between the shoulder blades radiating to the anterior part of the chest made worse with breathing it is very concerning for pulmonary embolism. Chest x-ray was negative troponins negative EKG without acute findings have ordered a CT angiogram of the chest. This is to rule out PE if that's negative patient can be discharged home with pain medicine.    I personally performed the services described in this documentation,  which was scribed in my presence. The recorded information has been reviewed and considered.     Theresa Jakes, MD 07/05/12 0001

## 2012-07-05 ENCOUNTER — Emergency Department (HOSPITAL_COMMUNITY): Payer: Self-pay

## 2012-07-05 MED ORDER — PROMETHAZINE HCL 25 MG PO TABS: 25.0000 mg | ORAL_TABLET | Freq: Four times a day (QID) | ORAL | Status: DC | PRN

## 2012-07-05 MED ORDER — HYDROCODONE-ACETAMINOPHEN 5-325 MG PO TABS: 1.0000 | ORAL_TABLET | Freq: Four times a day (QID) | ORAL | Status: AC | PRN

## 2012-07-05 MED ORDER — IOHEXOL 350 MG/ML SOLN
100.0000 mL | Freq: Once | INTRAVENOUS | Status: AC | PRN
  Administered 2012-07-05: 100 mL via INTRAVENOUS

## 2012-07-05 NOTE — ED Provider Notes (Signed)
Patient left to me at change of shift to get CT angiograms all. Radiologist called CT Angio results he states there was something on the left that he thinks is an incidental finding. Talking to patient she relates she had acute onset of pain between her shoulder blades that radiated around into her right chest and down her right arm. She did have some shortness of breath and it doesn't get worse when she breathes deep. However her pain is almost gone now and she feels ready to go home. She denies any pain on the left and has not had any left-sided chest pain in the past.  Dg Chest 2 View  07/04/2012  *RADIOLOGY REPORT*  Clinical Data: Chest pain  CHEST - 2 VIEW  Comparison: August 21, 2011  Findings: There is minimal scarring in the left base which is stable.  The lungs are otherwise clear.  The heart size and pulmonary vascularity are normal.  No adenopathy.  No bone lesions.  IMPRESSION: No edema or consolidation.   Original Report Authenticated By: Arvin Collard. WOODRUFF III, M.D.    Ct Angio Chest Pe W/cm &/or Wo Cm  07/05/2012  *RADIOLOGY REPORT*  Clinical Data: Chest pain, radiating to the right arm.  CT ANGIOGRAPHY CHEST  Technique:  Multidetector CT imaging of the chest using the standard protocol during bolus administration of intravenous contrast. Multiplanar reconstructed images including MIPs were obtained and reviewed to evaluate the vascular anatomy.  Contrast: OMNIPAQUE IOHEXOL 350 MG/ML SOLN  Comparison: 07/04/2012 radiograph, 09/25/2007 CT  Findings: There is a nonocclusive filling defect within a left lower lobe segmental artery as seen on series 9 image 103. Otherwise, the pulmonary arterial branches are patent.  Normal heart size.  Trace pericardial fluid. Trace pleural effusions.  No intrathoracic lymphadenopathy.  Tiny hiatal hernia. Normal caliber aorta.  Limited images through the upper abdomen show no acute finding. Status post cholecystectomy.  Bilateral dependent atelectasis.  Otherwise no focal consolidation.  No pneumothorax.  Central airways are patent.  No acute osseous finding.  IMPRESSION: Nonocclusive filling defect within a left lower lobe segmental pulmonary artery.  Given the clinical history, this is unlikely to be causing the patient's symptoms and is of indeterminate chronicity (not necessarily acute).  Trace pleural effusions and mild bibasilar atelectasis.   Original Report Authenticated By: Waneta Martins, M.D.     Diagnoses that have been ruled out:  None  Diagnoses that are still under consideration:  None  Final diagnoses:  Chest pain  Back pain  Shortness of breath   Plan discharge  Devoria Albe, MD, Franz Dell, MD 07/05/12 0200

## 2014-03-11 ENCOUNTER — Emergency Department (HOSPITAL_COMMUNITY)
Admission: EM | Admit: 2014-03-11 | Discharge: 2014-03-11 | Disposition: A | Discharge status: Home / Self Care | Payer: 59 | Attending: Emergency Medicine | Admitting: Emergency Medicine

## 2014-03-11 ENCOUNTER — Emergency Department (HOSPITAL_COMMUNITY): Payer: 59

## 2014-03-11 ENCOUNTER — Encounter (HOSPITAL_COMMUNITY): Payer: Self-pay | Admitting: Emergency Medicine

## 2014-03-11 DIAGNOSIS — F329 Major depressive disorder, single episode, unspecified: Secondary | ICD-10-CM | POA: Insufficient documentation

## 2014-03-11 DIAGNOSIS — Y939 Activity, unspecified: Secondary | ICD-10-CM | POA: Insufficient documentation

## 2014-03-11 DIAGNOSIS — S6391XA Sprain of unspecified part of right wrist and hand, initial encounter: Secondary | ICD-10-CM

## 2014-03-11 DIAGNOSIS — M129 Arthropathy, unspecified: Secondary | ICD-10-CM | POA: Insufficient documentation

## 2014-03-11 DIAGNOSIS — R509 Fever, unspecified: Secondary | ICD-10-CM | POA: Diagnosis not present

## 2014-03-11 DIAGNOSIS — Z79899 Other long term (current) drug therapy: Secondary | ICD-10-CM | POA: Insufficient documentation

## 2014-03-11 DIAGNOSIS — S6990XA Unspecified injury of unspecified wrist, hand and finger(s), initial encounter: Secondary | ICD-10-CM | POA: Diagnosis present

## 2014-03-11 DIAGNOSIS — Y929 Unspecified place or not applicable: Secondary | ICD-10-CM | POA: Diagnosis not present

## 2014-03-11 DIAGNOSIS — F411 Generalized anxiety disorder: Secondary | ICD-10-CM | POA: Diagnosis not present

## 2014-03-11 DIAGNOSIS — R296 Repeated falls: Secondary | ICD-10-CM | POA: Insufficient documentation

## 2014-03-11 DIAGNOSIS — F3289 Other specified depressive episodes: Secondary | ICD-10-CM | POA: Insufficient documentation

## 2014-03-11 DIAGNOSIS — S6390XA Sprain of unspecified part of unspecified wrist and hand, initial encounter: Secondary | ICD-10-CM | POA: Insufficient documentation

## 2014-03-11 MED ORDER — HYDROCODONE-ACETAMINOPHEN 5-325 MG PO TABS: ORAL_TABLET | ORAL | Status: DC

## 2014-03-11 NOTE — ED Notes (Signed)
[address] and caught herself with rt hand , pain rt hand  Since then   Feels better with heat

## 2014-03-11 NOTE — ED Notes (Signed)
PT fell this am 0100 and caught self with right hand. PT c/o right hand pain.

## 2014-03-14 NOTE — ED Provider Notes (Signed)
Medical screening examination/treatment/procedure(s) were performed by non-physician practitioner and as supervising physician I was immediately available for consultation/collaboration.   EKG Interpretation None       Nat Christen, MD 03/14/14 2005

## 2014-03-14 NOTE — ED Provider Notes (Signed)
CSN: [id number]     Arrival date & time 03/11/14  1655 History   First MD Initiated Contact with Patient 03/11/14 1731     Chief Complaint  Patient presents with  . Hand Pain     (Consider location/radiation/quality/duration/timing/severity/associated sxs/prior Treatment) Patient is a 43 y.o. female presenting with hand pain. The history is provided by the patient.  Hand Pain This is a new problem. The current episode started today. The problem occurs constantly. The problem has been unchanged. Associated symptoms include arthralgias, a fever and joint swelling. Pertinent negatives include no chills, nausea, neck pain, numbness, rash or vomiting. The symptoms are aggravated by bending and twisting. She has tried heat for the symptoms. The treatment provided mild relief.   patinet c/o pain to her right hand after a fall.  She states pain is worse with movement of the hand and improves with rest and heat.  She denies numbness or weakness of the hand or wrist.     Past Medical History  Diagnosis Date  . Anxiety   . Depression   . Arthritis    Past Surgical History  Procedure Laterality Date  . Cholecystectomy    . Eye surgery     History reviewed. No pertinent family history. History  Substance Use Topics  . Smoking status: Never Smoker   . Smokeless tobacco: Not on file  . Alcohol Use: No   OB History   Grav Para Term Preterm Abortions TAB SAB Ect Mult Living                 Review of Systems  Constitutional: Positive for fever. Negative for chills.  Gastrointestinal: Negative for nausea and vomiting.  Genitourinary: Negative for dysuria and difficulty urinating.  Musculoskeletal: Positive for arthralgias and joint swelling. Negative for neck pain.  Skin: Negative for color change, rash and wound.  Neurological: Negative for numbness.  All other systems reviewed and are negative.     Allergies  Naproxen  Home Medications   Prior to Admission medications    Medication Sig Start Date End Date Taking? Authorizing Provider  ibuprofen (ADVIL,MOTRIN) 200 MG tablet Take 400 mg by mouth every 6 (six) hours as needed.   Yes Historical Provider, MD  Misc Natural Products (COLON CLEANSE) CAPS Take 1 capsule by mouth daily.   Yes Historical Provider, MD  PARoxetine (PAXIL) 20 MG tablet Take 20 mg by mouth daily.   Yes Historical Provider, MD  risperiDONE (RISPERDAL) 1 MG tablet Take 1 mg by mouth daily.   Yes Historical Provider, MD  traZODone (DESYREL) 100 MG tablet Take 300 mg by mouth at bedtime.   Yes Historical Provider, MD  etonogestrel (IMPLANON) 68 MG IMPL implant Inject 1 each into the skin once.    Historical Provider, MD  HYDROcodone-acetaminophen (NORCO/VICODIN) 5-325 MG per tablet Take one-two tabs po q 4-6 hrs prn pain 03/11/14   Milcah Dulany L. Keila Turan, PA-C  promethazine (PHENERGAN) 25 MG tablet Take 1 tablet (25 mg total) by mouth every 6 (six) hours as needed for nausea. 07/05/12 07/12/12  Fredia Sorrow, MD   BP 138/89  Pulse 85  Temp(Src) 98.2 F (36.8 C) (Oral)  Resp 18  Ht 5\' 6"  (1.676 m)  Wt 283 lb (128.368 kg)  BMI 45.70 kg/m2  SpO2 99% Physical Exam  Nursing note and vitals reviewed. Constitutional: She is oriented to person, place, and time. She appears well-developed and well-nourished. No distress.  HENT:  Head: Normocephalic and atraumatic.  Cardiovascular: Normal rate, regular  rhythm and normal heart sounds.   No murmur heard. Pulmonary/Chest: Effort normal and breath sounds normal. No respiratory distress.  Musculoskeletal: She exhibits tenderness. She exhibits no edema.  Diffuse ttp of the dorsal right hand.  Mild STS present.    Radial pulse is brisk, distal sensation intact.  CR< 2 sec.  No bruising or bony deformity.  Patient has full ROM. Compartments soft. No proximal tenderness  Neurological: She is alert and oriented to person, place, and time. She exhibits normal muscle tone. Coordination normal.  Skin: Skin is warm  and dry.    ED Course  Procedures (including critical care time) Labs Review Labs Reviewed - No data to display  Imaging Review Dg Hand Complete Right  03/11/2014   CLINICAL DATA:  Right hand pain  EXAM: RIGHT HAND - COMPLETE 3+ VIEW  COMPARISON:  None.  FINDINGS: There is no evidence of fracture or dislocation. There is no evidence of arthropathy or other focal bone abnormality. Soft tissues are unremarkable.  IMPRESSION: No acute osseous finding   Electronically Signed   By: Daryll Brod M.D.   On: 03/11/2014 18:28    EKG Interpretation None      MDM   Final diagnoses:  Sprain of hand, right    sprain of the hand.  NV intact.  Wrist splint applied, remains NV intact.  Pain improved.  Agrees to elevate, ice and ortho f/u in one week if the sx's are not improving.      Linnell Swords L. Obie Silos, PA-C 03/14/14 0127

## 2014-04-21 ENCOUNTER — Emergency Department (HOSPITAL_COMMUNITY): Payer: 59

## 2014-04-21 ENCOUNTER — Encounter (HOSPITAL_COMMUNITY): Payer: Self-pay | Admitting: Emergency Medicine

## 2014-04-21 ENCOUNTER — Emergency Department (HOSPITAL_COMMUNITY)
Admission: EM | Admit: 2014-04-21 | Discharge: 2014-04-21 | Disposition: A | Discharge status: Home / Self Care | Payer: 59 | Attending: Emergency Medicine | Admitting: Emergency Medicine

## 2014-04-21 DIAGNOSIS — R0789 Other chest pain: Secondary | ICD-10-CM | POA: Diagnosis not present

## 2014-04-21 DIAGNOSIS — F411 Generalized anxiety disorder: Secondary | ICD-10-CM | POA: Insufficient documentation

## 2014-04-21 DIAGNOSIS — Z79899 Other long term (current) drug therapy: Secondary | ICD-10-CM | POA: Diagnosis not present

## 2014-04-21 DIAGNOSIS — M129 Arthropathy, unspecified: Secondary | ICD-10-CM | POA: Insufficient documentation

## 2014-04-21 DIAGNOSIS — F329 Major depressive disorder, single episode, unspecified: Secondary | ICD-10-CM | POA: Diagnosis not present

## 2014-04-21 DIAGNOSIS — R079 Chest pain, unspecified: Secondary | ICD-10-CM | POA: Diagnosis present

## 2014-04-21 DIAGNOSIS — F3289 Other specified depressive episodes: Secondary | ICD-10-CM | POA: Insufficient documentation

## 2014-04-21 DIAGNOSIS — M549 Dorsalgia, unspecified: Secondary | ICD-10-CM | POA: Diagnosis not present

## 2014-04-21 LAB — COMPREHENSIVE METABOLIC PANEL
ALT: 12 U/L (ref 0–35)
AST: 14 U/L (ref 0–37)
Albumin: 3.4 g/dL — ABNORMAL LOW (ref 3.5–5.2)
Alkaline Phosphatase: 76 U/L (ref 39–117)
Anion gap: 12 (ref 5–15)
BUN: 6 mg/dL (ref 6–23)
CALCIUM: 9.2 mg/dL (ref 8.4–10.5)
CO2: 23 mEq/L (ref 19–32)
CREATININE: 0.74 mg/dL (ref 0.50–1.10)
Chloride: 103 mEq/L (ref 96–112)
GFR calc Af Amer: >90 mL/min (ref >90)
GFR calc non Af Amer: >90 mL/min (ref >90)
Glucose, Bld: 95 mg/dL (ref 70–99)
Potassium: 3.8 mEq/L (ref 3.7–5.3)
SODIUM: 138 meq/L (ref 137–147)
Total Bilirubin: 0.2 mg/dL — ABNORMAL LOW (ref 0.3–1.2)
Total Protein: 7.2 g/dL (ref 6.0–8.3)

## 2014-04-21 LAB — CBC WITH DIFFERENTIAL/PLATELET
BASOS ABS: 0 10*3/uL (ref 0.0–0.1)
Basophils Relative: 0 % (ref 0–1)
EOS ABS: 0.1 10*3/uL (ref 0.0–0.7)
EOS PCT: 1 % (ref 0–5)
HCT: 41.7 % (ref 36.0–46.0)
Hemoglobin: 13.8 g/dL (ref 12.0–15.0)
Lymphocytes Relative: 32 % (ref 12–46)
Lymphs Abs: 2.6 10*3/uL (ref 0.7–4.0)
MCH: 29.6 pg (ref 26.0–34.0)
MCHC: 33.1 g/dL (ref 30.0–36.0)
MCV: 89.5 fL (ref 78.0–100.0)
Monocytes Absolute: 0.6 10*3/uL (ref 0.1–1.0)
Monocytes Relative: 8 % (ref 3–12)
Neutro Abs: 4.9 10*3/uL (ref 1.7–7.7)
Neutrophils Relative %: 59 % (ref 43–77)
Platelets: 306 10*3/uL (ref 150–400)
RBC: 4.66 MIL/uL (ref 3.87–5.11)
RDW: 12.9 % (ref 11.5–15.5)
WBC: 8.2 10*3/uL (ref 4.0–10.5)

## 2014-04-21 LAB — D-DIMER, QUANTITATIVE (NOT AT ARMC)

## 2014-04-21 LAB — TROPONIN I: Troponin I: <0.3 ng/mL (ref <0.30)

## 2014-04-21 MED ORDER — MORPHINE SULFATE 4 MG/ML IJ SOLN
INTRAMUSCULAR | Status: AC
  Administered 2014-04-21: 4 mg via INTRAVENOUS
  Filled 2014-04-21: qty 1

## 2014-04-21 MED ORDER — IOHEXOL 350 MG/ML SOLN
120.0000 mL | Freq: Once | INTRAVENOUS | Status: AC | PRN
  Administered 2014-04-21: 120 mL via INTRAVENOUS

## 2014-04-21 MED ORDER — ONDANSETRON HCL 4 MG PO TABS: 4.0000 mg | ORAL_TABLET | Freq: Four times a day (QID) | ORAL | Status: DC

## 2014-04-21 MED ORDER — MORPHINE SULFATE 4 MG/ML IJ SOLN
4.0000 mg | Freq: Once | INTRAMUSCULAR | Status: AC
  Administered 2014-04-21: 4 mg via INTRAVENOUS
  Filled 2014-04-21: qty 1

## 2014-04-21 MED ORDER — OMEPRAZOLE 20 MG PO CPDR: 20.0000 mg | DELAYED_RELEASE_CAPSULE | Freq: Every day | ORAL | Status: DC

## 2014-04-21 MED ORDER — MORPHINE SULFATE 4 MG/ML IJ SOLN
4.0000 mg | Freq: Once | INTRAMUSCULAR | Status: AC
  Administered 2014-04-21: 4 mg via INTRAVENOUS

## 2014-04-21 MED ORDER — ONDANSETRON HCL 4 MG/2ML IJ SOLN
4.0000 mg | Freq: Once | INTRAMUSCULAR | Status: AC
  Administered 2014-04-21: 4 mg via INTRAVENOUS
  Filled 2014-04-21: qty 2

## 2014-04-21 NOTE — ED Provider Notes (Signed)
Signed out from Dr. Wyvonnia Dusky pending CTA.  CTA negative for dissection. Does show small bilateral pleural effusions. Based on history, this is of unknown significance. We'll discharge home with plans for follow-up as discussed with patient by Dr. Wyvonnia Dusky.  Results for orders placed during the hospital encounter of 04/21/14  CBC WITH DIFFERENTIAL      Result Value Ref Range   WBC 8.2  4.0 - 10.5 K/uL   RBC 4.66  3.87 - 5.11 MIL/uL   Hemoglobin 13.8  12.0 - 15.0 g/dL   HCT 41.7  36.0 - 46.0 %   MCV 89.5  78.0 - 100.0 fL   MCH 29.6  26.0 - 34.0 pg   MCHC 33.1  30.0 - 36.0 g/dL   RDW 12.9  11.5 - 15.5 %   Platelets 306  150 - 400 K/uL   Neutrophils Relative % 59  43 - 77 %   Neutro Abs 4.9  1.7 - 7.7 K/uL   Lymphocytes Relative 32  12 - 46 %   Lymphs Abs 2.6  0.7 - 4.0 K/uL   Monocytes Relative 8  3 - 12 %   Monocytes Absolute 0.6  0.1 - 1.0 K/uL   Eosinophils Relative 1  0 - 5 %   Eosinophils Absolute 0.1  0.0 - 0.7 K/uL   Basophils Relative 0  0 - 1 %   Basophils Absolute 0.0  0.0 - 0.1 K/uL  COMPREHENSIVE METABOLIC PANEL      Result Value Ref Range   Sodium 138  137 - 147 mEq/L   Potassium 3.8  3.7 - 5.3 mEq/L   Chloride 103  96 - 112 mEq/L   CO2 23  19 - 32 mEq/L   Glucose, Bld 95  70 - 99 mg/dL   BUN 6  6 - 23 mg/dL   Creatinine, Ser 0.74  0.50 - 1.10 mg/dL   Calcium 9.2  8.4 - 10.5 mg/dL   Total Protein 7.2  6.0 - 8.3 g/dL   Albumin 3.4 (*) 3.5 - 5.2 g/dL   AST 14  0 - 37 U/L   ALT 12  0 - 35 U/L   Alkaline Phosphatase 76  39 - 117 U/L   Total Bilirubin 0.2 (*) 0.3 - 1.2 mg/dL   GFR calc non Af Amer >90  >90 mL/min   GFR calc Af Amer >90  >90 mL/min   Anion gap 12  5 - 15  TROPONIN I      Result Value Ref Range   Troponin I <0.30  <0.30 ng/mL  TROPONIN I      Result Value Ref Range   Troponin I <0.30  <0.30 ng/mL  D-DIMER, QUANTITATIVE      Result Value Ref Range   D-Dimer, Quant <0.27  0.00 - 0.48 ug/mL-FEU   Dg Chest 2 View  04/21/2014   CLINICAL DATA:   Chest pain, shortness of breath, nausea, and left arm pain with pain between the shoulder blades.  EXAM: CHEST  2 VIEW  COMPARISON:  Chest CT 07/05/2012 and chest radiographs 07/04/2012  FINDINGS: Cardiomediastinal silhouette is unchanged with the cardiac silhouette being upper limits of normal in size. Lungs remain mildly hypoinflated. Linear densities in the left lung base are stable to slightly increased compared to prior radiographs and most suggestive of subsegmental atelectasis or scarring. Right lung remains clear. No pleural effusion or pneumothorax is seen. No acute osseous abnormality is identified. Right upper quadrant abdominal surgical clips are noted.  IMPRESSION:  Left basilar atelectasis/ scarring.   Electronically Signed   By: Logan Bores   On: 04/21/2014 15:45   Ct Angio Chest Aorta W/cm &/or Wo/cm  04/21/2014   CLINICAL DATA:  Left arm and chest pain since yesterday.  Back pain.  EXAM: CT ANGIOGRAPHY CHEST, ABDOMEN AND PELVIS  TECHNIQUE: Multidetector CT imaging through the chest, abdomen and pelvis was performed using the standard protocol during bolus administration of intravenous contrast. Multiplanar reconstructed images and MIPs were obtained and reviewed to evaluate the vascular anatomy.  CONTRAST:  171mL OMNIPAQUE IOHEXOL 350 MG/ML SOLN  COMPARISON:  Chest CT 07/05/2012.  FINDINGS: CTA CHEST FINDINGS  Pre contrast images demonstrate no displaced intimal calcifications within the thoracic aorta. Post-contrast, the aorta enhances normally. There is no evidence of aneurysm or dissection. The pulmonary arteries are well opacified with contrast. There is no evidence of pulmonary embolism.  The heart size is normal. There is no pericardial effusion. Small bilateral pleural effusions are present. There is mild heterogeneous enlargement of the thyroid gland which appears stable.  There are no enlarged mediastinal, hilar or axillary lymph nodes. The lungs are clear.  Review of the MIP images  confirms the above findings.  CTA ABDOMEN AND PELVIS FINDINGS  The abdominal aorta appears normal. There is no evidence of aneurysm or dissection. The celiac trunk, superior and inferior mesenteric arteries appear normal. Both renal arteries are patent. The iliac arteries appear normal. No venous abnormalities are apparent.  As evaluated in the early phase of contrast, the liver, spleen, pancreas, adrenal glands and kidneys appear normal. The gallbladder is surgically absent.  The stomach, small bowel, appendix and colon appear normal. No inflammatory changes or enlarged lymph nodes are seen. The urinary bladder, uterus and ovaries appear normal. There is a small supraumbilical hernia containing only fat.  No acute osseous findings are demonstrated.  Review of the MIP images confirms the above findings.  IMPRESSION: 1. Negative CTA of the chest, abdomen and pelvis. No evidence of aneurysm, dissection or pulmonary embolism. 2. Small bilateral pleural effusions. 3. Stable mild thyromegaly. 4. No acute abdominal findings status post cholecystectomy. 5. Small supraumbilical hernia containing only fat.   Electronically Signed   By: Camie Patience M.D.   On: 04/21/2014 23:02   Ct Angio Abd/pel W/ And/or W/o  04/21/2014   CLINICAL DATA:  Left arm and chest pain since yesterday.  Back pain.  EXAM: CT ANGIOGRAPHY CHEST, ABDOMEN AND PELVIS  TECHNIQUE: Multidetector CT imaging through the chest, abdomen and pelvis was performed using the standard protocol during bolus administration of intravenous contrast. Multiplanar reconstructed images and MIPs were obtained and reviewed to evaluate the vascular anatomy.  CONTRAST:  18mL OMNIPAQUE IOHEXOL 350 MG/ML SOLN  COMPARISON:  Chest CT 07/05/2012.  FINDINGS: CTA CHEST FINDINGS  Pre contrast images demonstrate no displaced intimal calcifications within the thoracic aorta. Post-contrast, the aorta enhances normally. There is no evidence of aneurysm or dissection. The pulmonary  arteries are well opacified with contrast. There is no evidence of pulmonary embolism.  The heart size is normal. There is no pericardial effusion. Small bilateral pleural effusions are present. There is mild heterogeneous enlargement of the thyroid gland which appears stable.  There are no enlarged mediastinal, hilar or axillary lymph nodes. The lungs are clear.  Review of the MIP images confirms the above findings.  CTA ABDOMEN AND PELVIS FINDINGS  The abdominal aorta appears normal. There is no evidence of aneurysm or dissection. The celiac trunk, superior and inferior mesenteric  arteries appear normal. Both renal arteries are patent. The iliac arteries appear normal. No venous abnormalities are apparent.  As evaluated in the early phase of contrast, the liver, spleen, pancreas, adrenal glands and kidneys appear normal. The gallbladder is surgically absent.  The stomach, small bowel, appendix and colon appear normal. No inflammatory changes or enlarged lymph nodes are seen. The urinary bladder, uterus and ovaries appear normal. There is a small supraumbilical hernia containing only fat.  No acute osseous findings are demonstrated.  Review of the MIP images confirms the above findings.  IMPRESSION: 1. Negative CTA of the chest, abdomen and pelvis. No evidence of aneurysm, dissection or pulmonary embolism. 2. Small bilateral pleural effusions. 3. Stable mild thyromegaly. 4. No acute abdominal findings status post cholecystectomy. 5. Small supraumbilical hernia containing only fat.   Electronically Signed   By: Camie Patience M.D.   On: 04/21/2014 23:02      Merryl Hacker, MD 04/21/14 2316

## 2014-04-21 NOTE — ED Notes (Signed)
C/o of left arm pain which started yesterday. Today developed left chest pain. Describes as interment, sharp and aching.

## 2014-04-21 NOTE — ED Provider Notes (Signed)
CSN: [id number]     Arrival date & time 04/21/14  1512 History   First MD Initiated Contact with Patient 04/21/14 1931     Chief Complaint  Patient presents with  . Chest Pain     (Consider location/radiation/quality/duration/timing/severity/associated sxs/prior Treatment) HPI Comments: Patient presents with constant left arm pain has been ongoing since yesterday. The pain involves her arm diffusely, worse in the hand. Denies any trauma. Denies any weakness, numbness or tingling. This afternoon she developed an episode of central chest pain lasts about an hour. Now resolved. She's had some soreness and achiness in her upper back that has been ongoing throughout the day today as well. Denies abdominal pain, cough or fever. Denies any weakness, numbness or tingling. She denies any cardiac history. She denies any hypertension or hyperlipidemia. He does not smoke. Her pain is improved with ibuprofen.  The history is provided by the patient.    Past Medical History  Diagnosis Date  . Anxiety   . Depression   . Arthritis    Past Surgical History  Procedure Laterality Date  . Cholecystectomy    . Eye surgery     History reviewed. No pertinent family history. History  Substance Use Topics  . Smoking status: Never Smoker   . Smokeless tobacco: Not on file  . Alcohol Use: No   OB History   Grav Para Term Preterm Abortions TAB SAB Ect Mult Living                 Review of Systems  Constitutional: Negative for fever, activity change and appetite change.  Respiratory: Negative for shortness of breath.   Cardiovascular: Positive for chest pain.  Gastrointestinal: Negative for vomiting and abdominal pain.  Genitourinary: Negative for dysuria, hematuria, vaginal bleeding and vaginal discharge.  Musculoskeletal: Positive for back pain. Negative for arthralgias and myalgias.  Skin: Negative for rash.  Neurological: Negative for dizziness, weakness and headaches.  A complete 10 system  review of systems was obtained and all systems are negative except as noted in the HPI and PMH.      Allergies  Naproxen  Home Medications   Prior to Admission medications   Medication Sig Start Date End Date Taking? Authorizing Provider  ibuprofen (ADVIL,MOTRIN) 200 MG tablet Take 400-800 mg by mouth every 6 (six) hours as needed.    Yes Historical Provider, MD  Misc Natural Products (COLON CLEANSE) CAPS Take 1 capsule by mouth daily.   Yes Historical Provider, MD  PARoxetine (PAXIL) 20 MG tablet Take 20 mg by mouth daily.   Yes Historical Provider, MD  risperiDONE (RISPERDAL) 1 MG tablet Take 1 mg by mouth daily.   Yes Historical Provider, MD  traZODone (DESYREL) 100 MG tablet Take 300 mg by mouth at bedtime.   Yes Historical Provider, MD  etonogestrel (IMPLANON) 68 MG IMPL implant Inject 1 each into the skin once.    Historical Provider, MD  omeprazole (PRILOSEC) 20 MG capsule Take 1 capsule (20 mg total) by mouth daily. 04/21/14   Ezequiel Essex, MD  ondansetron (ZOFRAN) 4 MG tablet Take 1 tablet (4 mg total) by mouth every 6 (six) hours. 04/21/14   Ezequiel Essex, MD   BP 122/85  Pulse 65  Temp(Src) 98.3 F (36.8 C) (Oral)  Resp 20  Ht 5\' 6"  (1.676 m)  Wt 260 lb (117.935 kg)  BMI 41.99 kg/m2  SpO2 100% Physical Exam  Nursing note and vitals reviewed. Constitutional: She is oriented to person, place, and time. She  appears well-developed and well-nourished. No distress.  HENT:  Head: Normocephalic and atraumatic.  Mouth/Throat: Oropharynx is clear and moist. No oropharyngeal exudate.  Eyes: Conjunctivae and EOM are normal. Pupils are equal, round, and reactive to light.  Neck: Normal range of motion. Neck supple.  No meningismus.  Cardiovascular: Normal rate, regular rhythm, normal heart sounds and intact distal pulses.   No murmur heard. Pulmonary/Chest: Effort normal and breath sounds normal. No respiratory distress.  Abdominal: Soft. There is no tenderness. There is no  rebound and no guarding.  Musculoskeletal: Normal range of motion. She exhibits tenderness. She exhibits no edema.  TTP thoracic paraspinal muscles Equal radial pulses, cardinal hand movements intact bilaterally.  Neurological: She is alert and oriented to person, place, and time. No cranial nerve deficit. She exhibits normal muscle tone. Coordination normal.  No ataxia on finger to nose bilaterally. No pronator drift. 5/5 strength throughout. CN 2-12 intact. Negative Romberg. Equal grip strength. Sensation intact. Gait is normal.   Skin: Skin is warm.  Psychiatric: She has a normal mood and affect. Her behavior is normal.    ED Course  Procedures (including critical care time) Labs Review Labs Reviewed  COMPREHENSIVE METABOLIC PANEL - Abnormal; Notable for the following:    Albumin 3.4 (*)    Total Bilirubin 0.2 (*)    All other components within normal limits  CBC WITH DIFFERENTIAL  TROPONIN I  TROPONIN I  D-DIMER, QUANTITATIVE    Imaging Review Dg Chest 2 View  04/21/2014   CLINICAL DATA:  Chest pain, shortness of breath, nausea, and left arm pain with pain between the shoulder blades.  EXAM: CHEST  2 VIEW  COMPARISON:  Chest CT 07/05/2012 and chest radiographs 07/04/2012  FINDINGS: Cardiomediastinal silhouette is unchanged with the cardiac silhouette being upper limits of normal in size. Lungs remain mildly hypoinflated. Linear densities in the left lung base are stable to slightly increased compared to prior radiographs and most suggestive of subsegmental atelectasis or scarring. Right lung remains clear. No pleural effusion or pneumothorax is seen. No acute osseous abnormality is identified. Right upper quadrant abdominal surgical clips are noted.  IMPRESSION: Left basilar atelectasis/ scarring.   Electronically Signed   By: Logan Bores   On: 04/21/2014 15:45   Ct Angio Chest Aorta W/cm &/or Wo/cm  04/21/2014   CLINICAL DATA:  Left arm and chest pain since yesterday.  Back pain.   EXAM: CT ANGIOGRAPHY CHEST, ABDOMEN AND PELVIS  TECHNIQUE: Multidetector CT imaging through the chest, abdomen and pelvis was performed using the standard protocol during bolus administration of intravenous contrast. Multiplanar reconstructed images and MIPs were obtained and reviewed to evaluate the vascular anatomy.  CONTRAST:  132mL OMNIPAQUE IOHEXOL 350 MG/ML SOLN  COMPARISON:  Chest CT 07/05/2012.  FINDINGS: CTA CHEST FINDINGS  Pre contrast images demonstrate no displaced intimal calcifications within the thoracic aorta. Post-contrast, the aorta enhances normally. There is no evidence of aneurysm or dissection. The pulmonary arteries are well opacified with contrast. There is no evidence of pulmonary embolism.  The heart size is normal. There is no pericardial effusion. Small bilateral pleural effusions are present. There is mild heterogeneous enlargement of the thyroid gland which appears stable.  There are no enlarged mediastinal, hilar or axillary lymph nodes. The lungs are clear.  Review of the MIP images confirms the above findings.  CTA ABDOMEN AND PELVIS FINDINGS  The abdominal aorta appears normal. There is no evidence of aneurysm or dissection. The celiac trunk, superior and inferior mesenteric  arteries appear normal. Both renal arteries are patent. The iliac arteries appear normal. No venous abnormalities are apparent.  As evaluated in the early phase of contrast, the liver, spleen, pancreas, adrenal glands and kidneys appear normal. The gallbladder is surgically absent.  The stomach, small bowel, appendix and colon appear normal. No inflammatory changes or enlarged lymph nodes are seen. The urinary bladder, uterus and ovaries appear normal. There is a small supraumbilical hernia containing only fat.  No acute osseous findings are demonstrated.  Review of the MIP images confirms the above findings.  IMPRESSION: 1. Negative CTA of the chest, abdomen and pelvis. No evidence of aneurysm, dissection or  pulmonary embolism. 2. Small bilateral pleural effusions. 3. Stable mild thyromegaly. 4. No acute abdominal findings status post cholecystectomy. 5. Small supraumbilical hernia containing only fat.   Electronically Signed   By: Camie Patience M.D.   On: 04/21/2014 23:02   Ct Angio Abd/pel W/ And/or W/o  04/21/2014   CLINICAL DATA:  Left arm and chest pain since yesterday.  Back pain.  EXAM: CT ANGIOGRAPHY CHEST, ABDOMEN AND PELVIS  TECHNIQUE: Multidetector CT imaging through the chest, abdomen and pelvis was performed using the standard protocol during bolus administration of intravenous contrast. Multiplanar reconstructed images and MIPs were obtained and reviewed to evaluate the vascular anatomy.  CONTRAST:  137mL OMNIPAQUE IOHEXOL 350 MG/ML SOLN  COMPARISON:  Chest CT 07/05/2012.  FINDINGS: CTA CHEST FINDINGS  Pre contrast images demonstrate no displaced intimal calcifications within the thoracic aorta. Post-contrast, the aorta enhances normally. There is no evidence of aneurysm or dissection. The pulmonary arteries are well opacified with contrast. There is no evidence of pulmonary embolism.  The heart size is normal. There is no pericardial effusion. Small bilateral pleural effusions are present. There is mild heterogeneous enlargement of the thyroid gland which appears stable.  There are no enlarged mediastinal, hilar or axillary lymph nodes. The lungs are clear.  Review of the MIP images confirms the above findings.  CTA ABDOMEN AND PELVIS FINDINGS  The abdominal aorta appears normal. There is no evidence of aneurysm or dissection. The celiac trunk, superior and inferior mesenteric arteries appear normal. Both renal arteries are patent. The iliac arteries appear normal. No venous abnormalities are apparent.  As evaluated in the early phase of contrast, the liver, spleen, pancreas, adrenal glands and kidneys appear normal. The gallbladder is surgically absent.  The stomach, small bowel, appendix and colon  appear normal. No inflammatory changes or enlarged lymph nodes are seen. The urinary bladder, uterus and ovaries appear normal. There is a small supraumbilical hernia containing only fat.  No acute osseous findings are demonstrated.  Review of the MIP images confirms the above findings.  IMPRESSION: 1. Negative CTA of the chest, abdomen and pelvis. No evidence of aneurysm, dissection or pulmonary embolism. 2. Small bilateral pleural effusions. 3. Stable mild thyromegaly. 4. No acute abdominal findings status post cholecystectomy. 5. Small supraumbilical hernia containing only fat.   Electronically Signed   By: Camie Patience M.D.   On: 04/21/2014 23:02     EKG Interpretation   Date/Time:  Monday April 21 2014 15:19:08 EDT Ventricular Rate:  87 PR Interval:  134 QRS Duration: 86 QT Interval:  364 QTC Calculation: 438 R Axis:   -40 Text Interpretation:  Normal sinus rhythm Left axis deviation Pulmonary  disease pattern Abnormal ECG No significant change was found Confirmed by  Wyvonnia Dusky  MD, Breionna Punt [phone]) on 04/21/2014 7:45:56 PM      MDM  Final diagnoses:  Atypical chest pain   L arm pain constant since yesterday, worse with movement.  No trauma.  Episode of chest pain today, lasting one hour as well as soreness in upper back.  NO SOB, nausea, diaphoresis, vomiting.  Non focal neuro exam. EKG nsr.  No chest pain on exam.  Troponin negative, d-dimer negative. CXR negative. Second troponin is negative. Low suspicion for ACS, PE, aortic dissection. No weakness, numbness, tingling to suggest CVA.  Equal strength and sensation on exam  Treat arm pain symptomatically. Stable for follow up with PCP. Return precautions discussed.    Ezequiel Essex, MD 04/21/14 [phone]

## 2014-04-21 NOTE — Discharge Instructions (Signed)
Chest Pain (Nonspecific) °There is no evidence of heart attack or blood clot in the lung. Follow up with your doctor. Return to the ED if you develop new or worsening symptoms. °It is often hard to give a specific diagnosis for the cause of chest pain. There is always a chance that your pain could be related to something serious, such as a heart attack or a blood clot in the lungs. You need to follow up with your health care provider for further evaluation. °CAUSES  °· Heartburn. °· Pneumonia or bronchitis. °· Anxiety or stress. °· Inflammation around your heart (pericarditis) or lung (pleuritis or pleurisy). °· A blood clot in the lung. °· A collapsed lung (pneumothorax). It can develop suddenly on its own (spontaneous pneumothorax) or from trauma to the chest. °· Shingles infection (herpes zoster virus). °The chest wall is composed of bones, muscles, and cartilage. Any of these can be the source of the pain. °· The bones can be bruised by injury. °· The muscles or cartilage can be strained by coughing or overwork. °· The cartilage can be affected by inflammation and become sore (costochondritis). °DIAGNOSIS  °Lab tests or other studies may be needed to find the cause of your pain. Your health care provider may have you take a test called an ambulatory electrocardiogram (ECG). An ECG records your heartbeat patterns over a 24-hour period. You may also have other tests, such as: °· Transthoracic echocardiogram (TTE). During echocardiography, sound waves are used to evaluate how blood flows through your heart. °· Transesophageal echocardiogram (TEE). °· Cardiac monitoring. This allows your health care provider to monitor your heart rate and rhythm in real time. °· Holter monitor. This is a portable device that records your heartbeat and can help diagnose heart arrhythmias. It allows your health care provider to track your heart activity for several days, if needed. °· Stress tests by exercise or by giving medicine  that makes the heart beat faster. °TREATMENT  °· Treatment depends on what may be causing your chest pain. Treatment may include: °¨ Acid blockers for heartburn. °¨ Anti-inflammatory medicine. °¨ Pain medicine for inflammatory conditions. °¨ Antibiotics if an infection is present. °· You may be advised to change lifestyle habits. This includes stopping smoking and avoiding alcohol, caffeine, and chocolate. °· You may be advised to keep your head raised (elevated) when sleeping. This reduces the chance of acid going backward from your stomach into your esophagus. °Most of the time, nonspecific chest pain will improve within 2-3 days with rest and mild pain medicine.  °HOME CARE INSTRUCTIONS  °· If antibiotics were prescribed, take them as directed. Finish them even if you start to feel better. °· For the next few days, avoid physical activities that bring on chest pain. Continue physical activities as directed. °· Do not use any tobacco products, including cigarettes, chewing tobacco, or electronic cigarettes. °· Avoid drinking alcohol. °· Only take medicine as directed by your health care provider. °· Follow your health care provider's suggestions for further testing if your chest pain does not go away. °· Keep any follow-up appointments you made. If you do not go to an appointment, you could develop lasting (chronic) problems with pain. If there is any problem keeping an appointment, call to reschedule. °SEEK MEDICAL CARE IF:  °· Your chest pain does not go away, even after treatment. °· You have a rash with blisters on your chest. °· You have a fever. °SEEK IMMEDIATE MEDICAL CARE IF:  °· You have increased   chest pain or pain that spreads to your arm, neck, jaw, back, or abdomen. °· You have shortness of breath. °· You have an increasing cough, or you cough up blood. °· You have severe back or abdominal pain. °· You feel nauseous or vomit. °· You have severe weakness. °· You faint. °· You have chills. °This is an  emergency. Do not wait to see if the pain will go away. Get medical help at once. Call your local emergency services (911 in [place] Do not drive yourself to the hospital. °MAKE SURE YOU:  °· Understand these instructions. °· Will watch your condition. °· Will get help right away if you are not doing well or get worse. °Document Released: 06/29/2005 Document Revised: 09/24/2013 Document Reviewed: 04/24/2008 °[employer]® Patient Information ©2015 [employer]. This information is not intended to replace advice given to you by your health care provider. Make sure you discuss any questions you have with your health care provider. ° °

## 2014-05-26 ENCOUNTER — Encounter (HOSPITAL_COMMUNITY): Payer: Self-pay | Admitting: Emergency Medicine

## 2014-05-26 ENCOUNTER — Emergency Department (HOSPITAL_COMMUNITY)
Admission: EM | Admit: 2014-05-26 | Discharge: 2014-05-26 | Disposition: A | Discharge status: Home / Self Care | Payer: 59 | Attending: Emergency Medicine | Admitting: Emergency Medicine

## 2014-05-26 ENCOUNTER — Emergency Department (HOSPITAL_COMMUNITY): Payer: 59

## 2014-05-26 DIAGNOSIS — F3289 Other specified depressive episodes: Secondary | ICD-10-CM | POA: Insufficient documentation

## 2014-05-26 DIAGNOSIS — K439 Ventral hernia without obstruction or gangrene: Secondary | ICD-10-CM | POA: Diagnosis not present

## 2014-05-26 DIAGNOSIS — F329 Major depressive disorder, single episode, unspecified: Secondary | ICD-10-CM | POA: Insufficient documentation

## 2014-05-26 DIAGNOSIS — Z79899 Other long term (current) drug therapy: Secondary | ICD-10-CM | POA: Insufficient documentation

## 2014-05-26 DIAGNOSIS — Z9089 Acquired absence of other organs: Secondary | ICD-10-CM | POA: Insufficient documentation

## 2014-05-26 DIAGNOSIS — R1033 Periumbilical pain: Secondary | ICD-10-CM | POA: Insufficient documentation

## 2014-05-26 DIAGNOSIS — F411 Generalized anxiety disorder: Secondary | ICD-10-CM | POA: Insufficient documentation

## 2014-05-26 DIAGNOSIS — M129 Arthropathy, unspecified: Secondary | ICD-10-CM | POA: Insufficient documentation

## 2014-05-26 LAB — CBC WITH DIFFERENTIAL/PLATELET
BASOS ABS: 0 10*3/uL (ref 0.0–0.1)
BASOS PCT: 0 % (ref 0–1)
Eosinophils Absolute: 0 10*3/uL (ref 0.0–0.7)
Eosinophils Relative: 1 % (ref 0–5)
HCT: 41.3 % (ref 36.0–46.0)
HEMOGLOBIN: 13.8 g/dL (ref 12.0–15.0)
Lymphocytes Relative: 34 % (ref 12–46)
Lymphs Abs: 2.7 10*3/uL (ref 0.7–4.0)
MCH: 30.4 pg (ref 26.0–34.0)
MCHC: 33.4 g/dL (ref 30.0–36.0)
MCV: 91 fL (ref 78.0–100.0)
MONOS PCT: 6 % (ref 3–12)
Monocytes Absolute: 0.5 10*3/uL (ref 0.1–1.0)
NEUTROS ABS: 4.8 10*3/uL (ref 1.7–7.7)
Neutrophils Relative %: 59 % (ref 43–77)
PLATELETS: 316 10*3/uL (ref 150–400)
RBC: 4.54 MIL/uL (ref 3.87–5.11)
RDW: 13 % (ref 11.5–15.5)
WBC: 8 10*3/uL (ref 4.0–10.5)

## 2014-05-26 LAB — COMPREHENSIVE METABOLIC PANEL
ALBUMIN: 3.4 g/dL — AB (ref 3.5–5.2)
ALK PHOS: 77 U/L (ref 39–117)
ALT: 15 U/L (ref 0–35)
AST: 16 U/L (ref 0–37)
Anion gap: 12 (ref 5–15)
BUN: 6 mg/dL (ref 6–23)
CHLORIDE: 101 meq/L (ref 96–112)
CO2: 26 mEq/L (ref 19–32)
Calcium: 9.2 mg/dL (ref 8.4–10.5)
Creatinine, Ser: 0.72 mg/dL (ref 0.50–1.10)
GFR calc Af Amer: >90 mL/min (ref >90)
GFR calc non Af Amer: >90 mL/min (ref >90)
Glucose, Bld: 103 mg/dL — ABNORMAL HIGH (ref 70–99)
POTASSIUM: 3.6 meq/L — AB (ref 3.7–5.3)
SODIUM: 139 meq/L (ref 137–147)
TOTAL PROTEIN: 7.4 g/dL (ref 6.0–8.3)
Total Bilirubin: 0.3 mg/dL (ref 0.3–1.2)

## 2014-05-26 MED ORDER — IOHEXOL 300 MG/ML  SOLN
100.0000 mL | Freq: Once | INTRAMUSCULAR | Status: AC | PRN
  Administered 2014-05-26: 100 mL via INTRAVENOUS

## 2014-05-26 MED ORDER — IOHEXOL 300 MG/ML  SOLN
50.0000 mL | Freq: Once | INTRAMUSCULAR | Status: AC | PRN
  Administered 2014-05-26: 50 mL via ORAL

## 2014-05-26 MED ORDER — TRAMADOL HCL 50 MG PO TABS: 50.0000 mg | ORAL_TABLET | Freq: Four times a day (QID) | ORAL | Status: DC | PRN

## 2014-05-26 NOTE — ED Notes (Signed)
Report given to Tiffany RN 

## 2014-05-26 NOTE — ED Notes (Signed)
Patient given discharge instruction, verbalized understand. IV removed, band aid applied. Patient ambulatory out of the department.  

## 2014-05-26 NOTE — ED Provider Notes (Signed)
CSN: [id number]     Arrival date & time 05/26/14  1450 History   First MD Initiated Contact with Patient 05/26/14 1658     Chief Complaint  Patient presents with  . Abdominal Pain     (Consider location/radiation/quality/duration/timing/severity/associated sxs/prior Treatment) Patient is a 43 y.o. female presenting with abdominal pain. The history is provided by the patient (the pt complains of abd pain).  Abdominal Pain Pain location:  Periumbilical Pain quality: aching   Pain radiates to:  Does not radiate Pain severity:  Mild Onset quality:  Gradual Timing:  Constant Progression:  Unchanged Chronicity:  New Associated symptoms: no chest pain, no cough, no diarrhea, no fatigue and no hematuria     Past Medical History  Diagnosis Date  . Anxiety   . Depression   . Arthritis    Past Surgical History  Procedure Laterality Date  . Cholecystectomy    . Eye surgery     History reviewed. No pertinent family history. History  Substance Use Topics  . Smoking status: Never Smoker   . Smokeless tobacco: Not on file  . Alcohol Use: No   OB History   Grav Para Term Preterm Abortions TAB SAB Ect Mult Living                 Review of Systems  Constitutional: Negative for appetite change and fatigue.  HENT: Negative for congestion, ear discharge and sinus pressure.   Eyes: Negative for discharge.  Respiratory: Negative for cough.   Cardiovascular: Negative for chest pain.  Gastrointestinal: Positive for abdominal pain. Negative for diarrhea.  Genitourinary: Negative for frequency and hematuria.  Musculoskeletal: Negative for back pain.  Skin: Negative for rash.  Neurological: Negative for seizures and headaches.  Psychiatric/Behavioral: Negative for hallucinations.      Allergies  Naproxen  Home Medications   Prior to Admission medications   Medication Sig Start Date End Date Taking? Authorizing Provider  ibuprofen (ADVIL,MOTRIN) 200 MG tablet Take 400-800 mg by  mouth every 6 (six) hours as needed.    Yes Historical Provider, MD  Misc Natural Products (COLON CLEANSE) CAPS Take 1 capsule by mouth daily.   Yes Historical Provider, MD  omeprazole (PRILOSEC) 20 MG capsule Take 1 capsule (20 mg total) by mouth daily. 04/21/14  Yes Ezequiel Essex, MD  ondansetron (ZOFRAN) 4 MG tablet Take 1 tablet (4 mg total) by mouth every 6 (six) hours. 04/21/14  Yes Ezequiel Essex, MD  PARoxetine (PAXIL) 20 MG tablet Take 20 mg by mouth daily.   Yes Historical Provider, MD  risperiDONE (RISPERDAL) 1 MG tablet Take 1 mg by mouth daily.   Yes Historical Provider, MD  traZODone (DESYREL) 100 MG tablet Take 300 mg by mouth at bedtime.   Yes Historical Provider, MD  etonogestrel (IMPLANON) 68 MG IMPL implant Inject 1 each into the skin once.    Historical Provider, MD  traMADol (ULTRAM) 50 MG tablet Take 1 tablet (50 mg total) by mouth every 6 (six) hours as needed. 05/26/14   Maudry Diego, MD   BP 125/80  Pulse 61  Temp(Src) 97.9 F (36.6 C) (Oral)  Resp 16  Ht 5\' 6"  (1.676 m)  Wt 280 lb (127.007 kg)  BMI 45.21 kg/m2  SpO2 100% Physical Exam  Constitutional: She is oriented to person, place, and time. She appears well-developed.  HENT:  Head: Normocephalic.  Eyes: Conjunctivae and EOM are normal. No scleral icterus.  Neck: Neck supple. No thyromegaly present.  Cardiovascular: Normal rate and  regular rhythm.  Exam reveals no gallop and no friction rub.   No murmur heard. Pulmonary/Chest: No stridor. She has no wheezes. She has no rales. She exhibits no tenderness.  Abdominal: She exhibits no distension. There is tenderness. There is no rebound.  Tender 2cm mass superior to umbilicus  Musculoskeletal: Normal range of motion. She exhibits no edema.  Lymphadenopathy:    She has no cervical adenopathy.  Neurological: She is oriented to person, place, and time. She exhibits normal muscle tone. Coordination normal.  Skin: No rash noted. No erythema.  Psychiatric: She  has a normal mood and affect. Her behavior is normal.    ED Course  Procedures (including critical care time) Labs Review Labs Reviewed  COMPREHENSIVE METABOLIC PANEL - Abnormal; Notable for the following:    Potassium 3.6 (*)    Glucose, Bld 103 (*)    Albumin 3.4 (*)    All other components within normal limits  CBC WITH DIFFERENTIAL    Imaging Review Ct Abdomen Pelvis W Contrast  05/26/2014   CLINICAL DATA:  Abdominal pain, nausea and palpable mass felt to represent a hernia.  EXAM: CT ABDOMEN AND PELVIS WITH CONTRAST  TECHNIQUE: Multidetector CT imaging of the abdomen and pelvis was performed using the standard protocol following bolus administration of intravenous contrast.  CONTRAST:  40mL OMNIPAQUE IOHEXOL 300 MG/ML SOLN, 151mL OMNIPAQUE IOHEXOL 300 MG/ML SOLN  COMPARISON:  04/21/2014.  FINDINGS: Small supraumbilical hernia containing fat. Cholecystectomy clips. Minimal diffuse low density of the liver relative to the spleen. Unremarkable spleen, pancreas, adrenal glands, kidneys, urinary bladder, uterus and ovaries. No gastrointestinal abnormalities or enlarged lymph nodes. Normal appearing appendix. Minimal bilateral pleural fluid. Minimal linear atelectasis or scarring at the left lung base. Left acetabular bone island. Mild lower thoracic spine degenerative changes.  IMPRESSION: 1. Small supraumbilical hernia containing fat. 2. Minimal diffuse hepatic steatosis. 3. Minimal bilateral pleural fluid.   Electronically Signed   By: Enrique Sack M.D.   On: 05/26/2014 18:51     EKG Interpretation None      MDM   Final diagnoses:  Abdominal wall hernia    Hernia.  Follow up with surgeon    Maudry Diego, MD 05/26/14 [phone]

## 2014-05-26 NOTE — Discharge Instructions (Signed)
Follow up with dr. Arnoldo Morale

## 2014-05-26 NOTE — ED Notes (Signed)
Pt with knot to abd pain for a week, believes it's a hernia and was unable to see PCP, pt states instructed to come here, + nausea, was helping lift a patient recently

## 2014-08-13 NOTE — H&P (Signed)
  NTS SOAP Note  Vital Signs:  Vitals as of: 12/05/350: Systolic 481: Diastolic 81: Heart Rate 78: Temp 97.39F: Height 50ft 6in: Weight 279Lbs 0 Ounces: BMI 45.03  BMI : 45.03 kg/m2  Subjective: This 43 year old female presents for of a swelling above the umbilicus.  Has been present for some time,  increasingly causing pain.  Made worse with straining.  Review of Symptoms:  chills,  fatigue headache Eyes:blurred vision bilateral, pain bilateral sinus problems Cardiovascular:  unremarkable Respiratory:unremarkable Gastrointestinabdominal pain, nausea, heartburn Genitourinary:unremarkable   joint,  neck,  and back pain Skin:unremarkable Hematolgic/Lymphatic:unremarkable   Allergic/Immunologic:unremarkable   Past Medical History:  Reviewed  Past Medical History  Surgical History: detached retina,  cholecystectomy  Medical Problems: depression Allergies: naproxen  Medications: risperal,  paxil,  trazadone   Social History:Reviewed  Social History  Preferred Language: English Race:  White Ethnicity: Not Hispanic / Latino Age: 43 year Marital Status:  S Alcohol: no   Smoking Status: Never smoker reviewed on 08/07/2014 Functional Status reviewed on 08/07/2014 ------------------------------------------------ Bathing: Normal Cooking: Normal Dressing: Normal Driving: Normal Eating: Normal Managing Meds: Normal Oral Care: Normal Shopping: Normal Toileting: Normal Transferring: Normal Walking: Normal Cognitive Status reviewed on 08/07/2014 ------------------------------------------------ Attention: Normal Decision Making: Normal Language: Normal Memory: Normal Motor: Normal Perception: Normal Problem Solving: Normal Visual and Spatial: Normal   Family History:Reviewed  Family Health History Family History is Unknown    Objective Information: General:Well appearing, well nourished in no distress. Neck:Supple without  lymphadenopathy.  Heart:RRR, no murmur or gallop.  Normal S1, S2.  No S3, S4.  Lungs:  CTA bilaterally, no wheezes, rhonchi, rales.  Breathing unlabored. Abdomen:Soft, NT/ND, no HSM, no masses.  Reducible supraumbilical hernia,  beneath a surgical scar  Assessment:Incisional hernia  Diagnoses: 553.21  K43.2 Incisional hernia (Incisional hernia without obstruction or gangrene)  Procedures: 85909 - OFFICE OUTPATIENT NEW 30 MINUTES    Plan:  Scheduled for incisional herniorrhaphy with mesh on 09/08/14.   Patient Education:Alternative treatments to surgery were discussed with patient (and family).  Risks and benefits  of procedure including bleeding,  infection,  mesh use,  and recurrence of the hernia were fully explained to the patient (and family) who gave informed consent. Patient/family questions were addressed.  Follow-up:Pending Surgery

## 2014-09-02 NOTE — Patient Instructions (Signed)
LAVETTE YANKOVICH  09/02/2014   Your procedure is scheduled on:  09/08/2014  Report to [facility] at 6:15 AM.  Call this number if you have problems the morning of surgery: [phone]   Remember:   Do not eat food or drink liquids after midnight.   Take these medicines the morning of surgery with A SIP OF WATER: PAXIL, RESPERDAL   Do not wear jewelry, make-up or nail polish.  Do not wear lotions, powders, or perfumes. You may wear deodorant.  Do not shave 48 hours prior to surgery. Men may shave face and neck.  Do not bring valuables to the hospital.  [facility] is not responsible for any belongings or valuables.               Contacts, dentures or bridgework may not be worn into surgery.  Leave suitcase in the car. After surgery it may be brought to your room.  For patients admitted to the hospital, discharge time is determined by your treatment team.               Patients discharged the day of surgery will not be allowed to drive home.  Name and phone number of your driver:   Special Instructions: Shower using CHG 2 nights before surgery and the night before surgery.  If you shower the day of surgery use CHG.  Use special wash - you have one bottle of CHG for all showers.  You should use approximately 1/3 of the bottle for each shower.   Please read over the following fact sheets that you were given: Surgical Site Infection Prevention and Anesthesia Post-op Instructions   PATIENT INSTRUCTIONS POST-ANESTHESIA  IMMEDIATELY FOLLOWING SURGERY:  Do not drive or operate machinery for the first twenty four hours after surgery.  Do not make any important decisions for twenty four hours after surgery or while taking narcotic pain medications or sedatives.  If you develop intractable nausea and vomiting or a severe headache please notify your doctor immediately.  FOLLOW-UP:  Please make an appointment with your surgeon as instructed. You do not need to follow up with anesthesia unless  specifically instructed to do so.  WOUND CARE INSTRUCTIONS (if applicable):  Keep a dry clean dressing on the anesthesia/puncture wound site if there is drainage.  Once the wound has quit draining you may leave it open to air.  Generally you should leave the bandage intact for twenty four hours unless there is drainage.  If the epidural site drains for more than 36-48 hours please call the anesthesia department.  QUESTIONS?:  Please feel free to call your physician or the hospital operator if you have any questions, and they will be happy to assist you.      Hernia A hernia happens when an organ inside your body pushes out through a weak spot in your belly (abdominal) wall. Most hernias get worse over time. They can often be pushed back into place (reduced). Surgery may be needed to repair hernias that cannot be pushed into place. HOME CARE  Keep doing normal activities.  Avoid lifting more than 10 pounds (4.5 kilograms).  Cough gently and avoid straining. Over time, these things will:  Increase your hernia size.  Irritate your hernia.  Break down hernia repairs.  Stop smoking.  Do not wear anything tight over your hernia. Do not keep the hernia in with an outside bandage.  Eat food that is high in fiber (fruit, vegetables, whole grains).  Drink enough fluids  to keep your pee (urine) clear or pale yellow.  Take medicines to make your poop soft (stool softeners) if you cannot poop (constipated). GET HELP RIGHT AWAY IF:   You have a fever.  You have belly pain that gets worse.  You feel sick to your stomach (nauseous) and throw up (vomit).  Your skin starts to bulge out.  Your hernia turns a different color, feels hard, or is tender.  You have increased pain or puffiness (swelling) around the hernia.  You poop more or less often.  Your poop does not look the way normally does.  You have watery poop (diarrhea).  You cannot push the hernia back in place by applying  gentle pressure while lying down. MAKE SURE YOU:   Understand these instructions.  Will watch your condition.  Will get help right away if you are not doing well or get worse. Document Released: 03/09/2010 Document Revised: 12/12/2011 Document Reviewed: 03/09/2010 [employer] Patient Information 2015 [place], [place]. This information is not intended to replace advice given to you by your health care provider. Make sure you discuss any questions you have with your health care provider.

## 2014-09-03 ENCOUNTER — Encounter (HOSPITAL_COMMUNITY)
Admission: RE | Admit: 2014-09-03 | Discharge: 2014-09-03 | Disposition: A | Discharge status: Home / Self Care | Payer: 59 | Source: Ambulatory Visit | Attending: General Surgery | Admitting: General Surgery

## 2014-09-03 NOTE — Patient Instructions (Signed)
[name]  09/03/2014   Your procedure is scheduled on:  09/08/2014  Report to [facility] at 6:15 AM.  Call this number if you have problems the morning of surgery: [phone]   Remember:   Do not eat food or drink liquids after midnight.   Take these medicines the morning of surgery with A SIP OF WATER: Paxil, Resperdal   Do not wear jewelry, make-up or nail polish.  Do not wear lotions, powders, or perfumes. You may wear deodorant.  Do not shave 48 hours prior to surgery. Men may shave face and neck.  Do not bring valuables to the hospital.  [facility] is not responsible for any belongings or valuables.               Contacts, dentures or bridgework may not be worn into surgery.  Leave suitcase in the car. After surgery it may be brought to your room.  For patients admitted to the hospital, discharge time is determined by your                treatment team.               Patients discharged the day of surgery will not be allowed to drive  home.  Name and phone number of your driver:   Special Instructions: Shower using CHG 2 nights before surgery and the night before surgery.  If you shower the day of surgery use CHG.  Use special wash - you have one bottle of CHG for all showers.  You should use approximately 1/3 of the bottle for each shower.   Please read over the following fact sheets that you were given: Surgical Site Infection Prevention and Anesthesia Post-op Instructions   PATIENT INSTRUCTIONS POST-ANESTHESIA  IMMEDIATELY FOLLOWING SURGERY:  Do not drive or operate machinery for the first twenty four hours after surgery.  Do not make any important decisions for twenty four hours after surgery or while taking narcotic pain medications or sedatives.  If you develop intractable nausea and vomiting or a severe headache please notify your doctor immediately.  FOLLOW-UP:  Please make an appointment with your surgeon as instructed. You do not need to follow up with  anesthesia unless specifically instructed to do so.  WOUND CARE INSTRUCTIONS (if applicable):  Keep a dry clean dressing on the anesthesia/puncture wound site if there is drainage.  Once the wound has quit draining you may leave it open to air.  Generally you should leave the bandage intact for twenty four hours unless there is drainage.  If the epidural site drains for more than 36-48 hours please call the anesthesia department.  QUESTIONS?:  Please feel free to call your physician or the hospital operator if you have any questions, and they will be happy to assist you.      Hernia A hernia occurs when an internal organ pushes out through a weak spot in the abdominal wall. Hernias most commonly occur in the groin and around the navel. Hernias often can be pushed back into place (reduced). Most hernias tend to get worse over time. Some abdominal hernias can get stuck in the opening (irreducible or incarcerated hernia) and cannot be reduced. An irreducible abdominal hernia which is tightly squeezed into the opening is at risk for impaired blood supply (strangulated hernia). A strangulated hernia is a medical emergency. Because of the risk for an irreducible or strangulated hernia, surgery may be recommended to repair a hernia. CAUSES   Heavy  lifting.  Prolonged coughing.  Straining to have a bowel movement.  A cut (incision) made during an abdominal surgery. HOME CARE INSTRUCTIONS   Bed rest is not required. You may continue your normal activities.  Avoid lifting more than 10 pounds (4.5 kg) or straining.  Cough gently. If you are a smoker it is best to stop. Even the best hernia repair can break down with the continual strain of coughing. Even if you do not have your hernia repaired, a cough will continue to aggravate the problem.  Do not wear anything tight over your hernia. Do not try to keep it in with an outside bandage or truss. These can damage abdominal contents if they are trapped  within the hernia sac.  Eat a normal diet.  Avoid constipation. Straining over long periods of time will increase hernia size and encourage breakdown of repairs. If you cannot do this with diet alone, stool softeners may be used. SEEK IMMEDIATE MEDICAL CARE IF:   You have a fever.  You develop increasing abdominal pain.  You feel nauseous or vomit.  Your hernia is stuck outside the abdomen, looks discolored, feels hard, or is tender.  You have any changes in your bowel habits or in the hernia that are unusual for you.  You have increased pain or swelling around the hernia.  You cannot push the hernia back in place by applying gentle pressure while lying down. MAKE SURE YOU:   Understand these instructions.  Will watch your condition.  Will get help right away if you are not doing well or get worse. Document Released: 09/19/2005 Document Revised: 12/12/2011 Document Reviewed: 05/08/2008 [facility] Patient Information 2015 [place], [place]. This information is not intended to replace advice given to you by your health care provider. Make sure you discuss any questions you have with your health care provider.

## 2014-09-04 ENCOUNTER — Encounter (HOSPITAL_COMMUNITY)
Admission: RE | Admit: 2014-09-04 | Discharge: 2014-09-04 | Disposition: A | Discharge status: Home / Self Care | Payer: 59 | Source: Ambulatory Visit | Attending: General Surgery | Admitting: General Surgery

## 2014-09-05 ENCOUNTER — Encounter (HOSPITAL_COMMUNITY): Payer: Self-pay

## 2014-09-05 ENCOUNTER — Encounter (HOSPITAL_COMMUNITY)
Admission: RE | Admit: 2014-09-05 | Discharge: 2014-09-05 | Disposition: A | Discharge status: Home / Self Care | Payer: Medicaid Other | Source: Ambulatory Visit | Attending: General Surgery | Admitting: General Surgery

## 2014-09-05 DIAGNOSIS — F329 Major depressive disorder, single episode, unspecified: Secondary | ICD-10-CM | POA: Diagnosis not present

## 2014-09-05 DIAGNOSIS — K432 Incisional hernia without obstruction or gangrene: Secondary | ICD-10-CM | POA: Diagnosis present

## 2014-09-05 HISTORY — DX: Unspecified convulsions: R56.9

## 2014-09-05 LAB — CBC WITH DIFFERENTIAL/PLATELET
Basophils Absolute: 0 10*3/uL (ref 0.0–0.1)
Basophils Relative: 0 % (ref 0–1)
Eosinophils Absolute: 0.1 10*3/uL (ref 0.0–0.7)
Eosinophils Relative: 1 % (ref 0–5)
HCT: 39.3 % (ref 36.0–46.0)
Hemoglobin: 13 g/dL (ref 12.0–15.0)
LYMPHS PCT: 38 % (ref 12–46)
Lymphs Abs: 2.3 10*3/uL (ref 0.7–4.0)
MCH: 30.1 pg (ref 26.0–34.0)
MCHC: 33.1 g/dL (ref 30.0–36.0)
MCV: 91 fL (ref 78.0–100.0)
Monocytes Absolute: 0.4 10*3/uL (ref 0.1–1.0)
Monocytes Relative: 7 % (ref 3–12)
NEUTROS ABS: 3.2 10*3/uL (ref 1.7–7.7)
NEUTROS PCT: 54 % (ref 43–77)
PLATELETS: 265 10*3/uL (ref 150–400)
RBC: 4.32 MIL/uL (ref 3.87–5.11)
RDW: 12.9 % (ref 11.5–15.5)
WBC: 5.9 10*3/uL (ref 4.0–10.5)

## 2014-09-05 LAB — BASIC METABOLIC PANEL
Anion gap: 12 (ref 5–15)
BUN: 6 mg/dL (ref 6–23)
CO2: 23 mEq/L (ref 19–32)
Calcium: 8.8 mg/dL (ref 8.4–10.5)
Chloride: 106 mEq/L (ref 96–112)
Creatinine, Ser: 0.77 mg/dL (ref 0.50–1.10)
GFR calc Af Amer: >90 mL/min (ref >90)
GLUCOSE: 114 mg/dL — AB (ref 70–99)
POTASSIUM: 3.8 meq/L (ref 3.7–5.3)
SODIUM: 141 meq/L (ref 137–147)

## 2014-09-05 LAB — HCG, SERUM, QUALITATIVE: Preg, Serum: NEGATIVE

## 2014-09-05 NOTE — Pre-Procedure Instructions (Signed)
Patient given information to sign up for my chart at home. 

## 2014-09-08 ENCOUNTER — Ambulatory Visit (HOSPITAL_COMMUNITY): Payer: Medicaid Other | Admitting: Anesthesiology

## 2014-09-08 ENCOUNTER — Encounter (HOSPITAL_COMMUNITY): Payer: Self-pay | Admitting: *Deleted

## 2014-09-08 ENCOUNTER — Encounter (HOSPITAL_COMMUNITY)
Admission: RE | Disposition: A | Discharge status: Home / Self Care | Payer: Self-pay | Source: Ambulatory Visit | Attending: General Surgery

## 2014-09-08 ENCOUNTER — Ambulatory Visit (HOSPITAL_COMMUNITY)
Admission: RE | Admit: 2014-09-08 | Discharge: 2014-09-08 | Disposition: A | Discharge status: Home / Self Care | Payer: Medicaid Other | Source: Ambulatory Visit | Attending: General Surgery | Admitting: General Surgery

## 2014-09-08 DIAGNOSIS — K432 Incisional hernia without obstruction or gangrene: Secondary | ICD-10-CM | POA: Insufficient documentation

## 2014-09-08 DIAGNOSIS — F329 Major depressive disorder, single episode, unspecified: Secondary | ICD-10-CM | POA: Insufficient documentation

## 2014-09-08 HISTORY — PX: INCISIONAL HERNIA REPAIR: SHX193

## 2014-09-08 HISTORY — PX: INSERTION OF MESH: SHX5868

## 2014-09-08 SURGERY — REPAIR, HERNIA, INCISIONAL
Anesthesia: General | Site: Abdomen

## 2014-09-08 MED ORDER — SODIUM CHLORIDE 0.9 % IJ SOLN
INTRAMUSCULAR | Status: AC
  Filled 2014-09-08: qty 10

## 2014-09-08 MED ORDER — POVIDONE-IODINE 10 % OINT PACKET
TOPICAL_OINTMENT | CUTANEOUS | Status: DC | PRN
  Administered 2014-09-08: 1 via TOPICAL

## 2014-09-08 MED ORDER — BUPIVACAINE HCL (PF) 0.5 % IJ SOLN
INTRAMUSCULAR | Status: AC
  Filled 2014-09-08: qty 30

## 2014-09-08 MED ORDER — GLYCOPYRROLATE 0.2 MG/ML IJ SOLN
INTRAMUSCULAR | Status: AC
  Filled 2014-09-08: qty 3

## 2014-09-08 MED ORDER — POVIDONE-IODINE 10 % EX OINT
TOPICAL_OINTMENT | CUTANEOUS | Status: AC
  Filled 2014-09-08: qty 1

## 2014-09-08 MED ORDER — CEFAZOLIN SODIUM-DEXTROSE 2-3 GM-% IV SOLR
INTRAVENOUS | Status: AC
  Filled 2014-09-08: qty 50

## 2014-09-08 MED ORDER — CHLORHEXIDINE GLUCONATE 4 % EX LIQD: 1.0000 "application " | Freq: Once | CUTANEOUS | Status: DC

## 2014-09-08 MED ORDER — NEOSTIGMINE METHYLSULFATE 10 MG/10ML IV SOLN
INTRAVENOUS | Status: AC
  Filled 2014-09-08: qty 1

## 2014-09-08 MED ORDER — LIDOCAINE HCL (PF) 1 % IJ SOLN
INTRAMUSCULAR | Status: AC
  Filled 2014-09-08: qty 10

## 2014-09-08 MED ORDER — SODIUM CHLORIDE 0.9 % IR SOLN
Status: DC | PRN
  Administered 2014-09-08: 1000 mL

## 2014-09-08 MED ORDER — FENTANYL CITRATE 0.05 MG/ML IJ SOLN
INTRAMUSCULAR | Status: AC
  Filled 2014-09-08: qty 2

## 2014-09-08 MED ORDER — FENTANYL CITRATE 0.05 MG/ML IJ SOLN
INTRAMUSCULAR | Status: DC | PRN
  Administered 2014-09-08 (×4): 50 ug via INTRAVENOUS

## 2014-09-08 MED ORDER — FENTANYL CITRATE 0.05 MG/ML IJ SOLN
25.0000 ug | INTRAMUSCULAR | Status: DC | PRN
  Administered 2014-09-08: 50 ug via INTRAVENOUS

## 2014-09-08 MED ORDER — KETOROLAC TROMETHAMINE 30 MG/ML IJ SOLN
30.0000 mg | Freq: Once | INTRAMUSCULAR | Status: AC
  Administered 2014-09-08: 30 mg via INTRAVENOUS
  Filled 2014-09-08: qty 1

## 2014-09-08 MED ORDER — DEXTROSE 5 % IV SOLN
3.0000 g | INTRAVENOUS | Status: DC
  Filled 2014-09-08: qty 3000

## 2014-09-08 MED ORDER — PROPOFOL 10 MG/ML IV EMUL
INTRAVENOUS | Status: AC
  Filled 2014-09-08: qty 20

## 2014-09-08 MED ORDER — CEFAZOLIN SODIUM-DEXTROSE 2-3 GM-% IV SOLR
2.0000 g | Freq: Once | INTRAVENOUS | Status: AC
  Administered 2014-09-08: 2 g via INTRAVENOUS

## 2014-09-08 MED ORDER — ROCURONIUM BROMIDE 50 MG/5ML IV SOLN
INTRAVENOUS | Status: AC
  Filled 2014-09-08: qty 1

## 2014-09-08 MED ORDER — LACTATED RINGERS IV SOLN
INTRAVENOUS | Status: DC | PRN
  Administered 2014-09-08: 07:00:00 via INTRAVENOUS

## 2014-09-08 MED ORDER — BUPIVACAINE HCL (PF) 0.5 % IJ SOLN
INTRAMUSCULAR | Status: DC | PRN
  Administered 2014-09-08: 10 mL

## 2014-09-08 MED ORDER — MIDAZOLAM HCL 2 MG/2ML IJ SOLN
INTRAMUSCULAR | Status: AC
  Filled 2014-09-08: qty 2

## 2014-09-08 MED ORDER — BUPIVACAINE LIPOSOME 1.3 % IJ SUSP
INTRAMUSCULAR | Status: AC
  Filled 2014-09-08: qty 20

## 2014-09-08 MED ORDER — GLYCOPYRROLATE 0.2 MG/ML IJ SOLN
INTRAMUSCULAR | Status: DC | PRN
  Administered 2014-09-08: 0.6 mg via INTRAVENOUS

## 2014-09-08 MED ORDER — ONDANSETRON HCL 4 MG/2ML IJ SOLN
INTRAMUSCULAR | Status: AC
  Filled 2014-09-08: qty 2

## 2014-09-08 MED ORDER — EPHEDRINE SULFATE 50 MG/ML IJ SOLN
INTRAMUSCULAR | Status: AC
  Filled 2014-09-08: qty 1

## 2014-09-08 MED ORDER — ONDANSETRON HCL 4 MG/2ML IJ SOLN: 4.0000 mg | Freq: Once | INTRAMUSCULAR | Status: DC | PRN

## 2014-09-08 MED ORDER — MIDAZOLAM HCL 2 MG/2ML IJ SOLN
1.0000 mg | INTRAMUSCULAR | Status: DC | PRN
  Administered 2014-09-08: 2 mg via INTRAVENOUS

## 2014-09-08 MED ORDER — LACTATED RINGERS IV SOLN
INTRAVENOUS | Status: DC
  Administered 2014-09-08: 07:00:00 via INTRAVENOUS

## 2014-09-08 MED ORDER — CEFAZOLIN SODIUM 1-5 GM-% IV SOLN
INTRAVENOUS | Status: AC
  Filled 2014-09-08: qty 50

## 2014-09-08 MED ORDER — PROPOFOL 10 MG/ML IV BOLUS
INTRAVENOUS | Status: DC | PRN
  Administered 2014-09-08: 150 mg via INTRAVENOUS

## 2014-09-08 MED ORDER — SUCCINYLCHOLINE CHLORIDE 20 MG/ML IJ SOLN
INTRAMUSCULAR | Status: DC | PRN
  Administered 2014-09-08: 120 mg via INTRAVENOUS

## 2014-09-08 MED ORDER — GLYCOPYRROLATE 0.2 MG/ML IJ SOLN
INTRAMUSCULAR | Status: AC
  Filled 2014-09-08: qty 5

## 2014-09-08 MED ORDER — CEFAZOLIN SODIUM 1-5 GM-% IV SOLN
1.0000 g | Freq: Once | INTRAVENOUS | Status: AC
  Administered 2014-09-08: 1 g via INTRAVENOUS

## 2014-09-08 MED ORDER — LIDOCAINE HCL (PF) 1 % IJ SOLN
INTRAMUSCULAR | Status: AC
  Filled 2014-09-08: qty 5

## 2014-09-08 MED ORDER — ARTIFICIAL TEARS OP OINT
TOPICAL_OINTMENT | OPHTHALMIC | Status: AC
  Filled 2014-09-08: qty 7

## 2014-09-08 MED ORDER — NEOSTIGMINE METHYLSULFATE 10 MG/10ML IV SOLN
INTRAVENOUS | Status: DC | PRN
  Administered 2014-09-08: 4 mg via INTRAVENOUS

## 2014-09-08 MED ORDER — LIDOCAINE HCL (CARDIAC) 20 MG/ML IV SOLN
INTRAVENOUS | Status: DC | PRN
  Administered 2014-09-08: 50 mg via INTRAVENOUS

## 2014-09-08 MED ORDER — ONDANSETRON HCL 4 MG/2ML IJ SOLN
4.0000 mg | Freq: Once | INTRAMUSCULAR | Status: AC
  Administered 2014-09-08: 4 mg via INTRAVENOUS

## 2014-09-08 MED ORDER — OXYCODONE-ACETAMINOPHEN 7.5-325 MG PO TABS: 1.0000 | ORAL_TABLET | ORAL | Status: DC | PRN

## 2014-09-08 MED ORDER — ROCURONIUM BROMIDE 100 MG/10ML IV SOLN
INTRAVENOUS | Status: DC | PRN
  Administered 2014-09-08 (×3): 10 mg via INTRAVENOUS

## 2014-09-08 SURGICAL SUPPLY — 48 items
BAG HAMPER (MISCELLANEOUS) ×4 IMPLANT
BLADE SURG SZ11 CARB STEEL (BLADE) ×4 IMPLANT
CHLORAPREP W/TINT 26ML (MISCELLANEOUS) ×4 IMPLANT
CLOTH BEACON ORANGE TIMEOUT ST (SAFETY) ×4 IMPLANT
COVER LIGHT HANDLE STERIS (MISCELLANEOUS) ×8 IMPLANT
DECANTER SPIKE VIAL GLASS SM (MISCELLANEOUS) ×4 IMPLANT
ELECT REM PT RETURN 9FT ADLT (ELECTROSURGICAL) ×4
ELECTRODE REM PT RTRN 9FT ADLT (ELECTROSURGICAL) ×2 IMPLANT
FORMALIN 10 PREFIL 480ML (MISCELLANEOUS) IMPLANT
GAUZE SPONGE 4X4 12PLY STRL (GAUZE/BANDAGES/DRESSINGS) ×4 IMPLANT
GLOVE BIO SURGEON STRL SZ 6.5 (GLOVE) ×3 IMPLANT
GLOVE BIO SURGEONS STRL SZ 6.5 (GLOVE) ×1
GLOVE BIOGEL PI IND STRL 7.0 (GLOVE) ×4 IMPLANT
GLOVE BIOGEL PI IND STRL 7.5 (GLOVE) ×2 IMPLANT
GLOVE BIOGEL PI INDICATOR 7.0 (GLOVE) ×4
GLOVE BIOGEL PI INDICATOR 7.5 (GLOVE) ×2
GLOVE ECLIPSE 6.5 STRL STRAW (GLOVE) ×4 IMPLANT
GLOVE SURG SS PI 7.5 STRL IVOR (GLOVE) ×4 IMPLANT
GOWN STRL REUS W/ TWL XL LVL3 (GOWN DISPOSABLE) ×2 IMPLANT
GOWN STRL REUS W/TWL LRG LVL3 (GOWN DISPOSABLE) ×8 IMPLANT
GOWN STRL REUS W/TWL XL LVL3 (GOWN DISPOSABLE) ×2
INST SET MAJOR GENERAL (KITS) ×4 IMPLANT
KIT ROOM TURNOVER APOR (KITS) ×4 IMPLANT
MANIFOLD NEPTUNE II (INSTRUMENTS) ×4 IMPLANT
MESH VENTRALEX ST 1-7/10 CRC S (Mesh General) ×4 IMPLANT
NEEDLE HYPO 25X1 1.5 SAFETY (NEEDLE) ×4 IMPLANT
NS IRRIG 1000ML POUR BTL (IV SOLUTION) ×4 IMPLANT
PACK ABDOMINAL MAJOR (CUSTOM PROCEDURE TRAY) ×4 IMPLANT
PAD ARMBOARD 7.5X6 YLW CONV (MISCELLANEOUS) ×4 IMPLANT
SET BASIN LINEN APH (SET/KITS/TRAYS/PACK) ×4 IMPLANT
SPONGE GAUZE 4X4 12PLY (GAUZE/BANDAGES/DRESSINGS) ×3 IMPLANT
STAPLER VISISTAT (STAPLE) ×4 IMPLANT
SUT ETHIBOND NAB MO 7 #0 18IN (SUTURE) ×4 IMPLANT
SUT NOVA NAB GS-21 1 T12 (SUTURE) IMPLANT
SUT NOVA NAB GS-22 2 2-0 T-19 (SUTURE) IMPLANT
SUT NOVA NAB GS-26 0 60 (SUTURE) IMPLANT
SUT PROLENE 0 CT 1 CR/8 (SUTURE) IMPLANT
SUT SILK 2 0 (SUTURE)
SUT SILK 2-0 18XBRD TIE 12 (SUTURE) IMPLANT
SUT VIC AB 2-0 CT1 27 (SUTURE) ×2
SUT VIC AB 2-0 CT1 TAPERPNT 27 (SUTURE) ×2 IMPLANT
SUT VIC AB 3-0 SH 27 (SUTURE) ×2
SUT VIC AB 3-0 SH 27X BRD (SUTURE) ×2 IMPLANT
SUT VIC AB 4-0 PS2 27 (SUTURE) ×4 IMPLANT
SUT VICRYL AB 2 0 TIES (SUTURE) ×4 IMPLANT
SYR 20CC LL (SYRINGE) IMPLANT
SYR CONTROL 10ML LL (SYRINGE) ×4 IMPLANT
TAPE CLOTH SURG 4X10 WHT LF (GAUZE/BANDAGES/DRESSINGS) ×4 IMPLANT

## 2014-09-08 NOTE — Anesthesia Procedure Notes (Signed)
Procedure Name: Intubation Date/Time: 09/08/2014 7:39 AM Performed by: Andree Elk, Tristyn Pharris A Pre-anesthesia Checklist: Patient identified, Patient being monitored, Timeout performed, Emergency Drugs available and Suction available Patient Re-evaluated:Patient Re-evaluated prior to inductionOxygen Delivery Method: Circle System Utilized Preoxygenation: Pre-oxygenation with 100% oxygen Intubation Type: IV induction Ventilation: Mask ventilation without difficulty Laryngoscope Size: 3 and Miller Grade View: Grade I Tube type: Oral Tube size: 7.0 mm Number of attempts: 1 Airway Equipment and Method: stylet Placement Confirmation: ETT inserted through vocal cords under direct vision,  positive ETCO2 and breath sounds checked- equal and bilateral Secured at: 21 cm Tube secured with: Tape Dental Injury: Teeth and Oropharynx as per pre-operative assessment

## 2014-09-08 NOTE — Op Note (Signed)
Patient:  [name]  DOB:  29-Jun-1971  MRN:  [id number]   Preop Diagnosis:  Incisional hernia  Postop Diagnosis:  Same  Procedure:  Incisional herniorrhaphy with mesh  Surgeon:  Aviva Signs, M.D.  Anes:  Gen. endotracheal  Indications:  Patient is a 43 year old morbidly obese white female who presents with an incisional hernia just superior to the umbilicus and a former trocar site. The risks and benefits of the procedure including bleeding, infection, recurrence of the hernia were fully explained to the patient, who gave informed consent.  Procedure note:  The patient was placed in the supine position. After induction of general endotracheal anesthesia, the abdomen was prepped and draped using usual sterile technique with DuraPrep. Surgical site confirmation was performed.  Midline incision was made superior to the umbilicus. The dissection was taken down to the fascia. The hernia defect was found. It was approximately 1 cm in its greatest diameter, with a mushroomed up pelvic hernia found. The adipose tissue was ligated and divided using a 2-0 Vicryl tie. The hernia was then reduced. A 4.3 cm Bard ventralex patch was then inserted and secured to the fascia using 2-0 Ethibond interrupted sutures. The fascia was then closed over this transversely using 2-0 Ethibond interrupted sutures. The subcutaneous layer was reapproximated using 2-0 Vicryl interrupted sutures. The skin was closed using staples. 0.5% Sensorcaine was instilled the surrounding wound. Betadine ointment and a dry sterile dressing were applied.  All tape and needle counts were correct at the end of the procedure. The patient was extubated in the operating room and transferred to PACU in stable condition.  Complications:  None  EBL:  Minimal  Specimen:  None

## 2014-09-08 NOTE — Discharge Instructions (Signed)
Open Hernia Repair, Care After °Refer to this sheet in the next few weeks. These instructions provide you with information on caring for yourself after your procedure. Your health care provider may also give you more specific instructions. Your treatment has been planned according to current medical practices, but problems sometimes occur. Call your health care provider if you have any problems or questions after your procedure. °WHAT TO EXPECT AFTER THE PROCEDURE °After your procedure, it is typical to have the following: °· Pain in your abdomen, especially along your incision. You will be given pain medicines to control the pain. °· Constipation. You may be given a stool softener to help prevent this. °HOME CARE INSTRUCTIONS  °· Only take over-the-counter or prescription medicines as directed by your health care provider. °· Keep the wound dry and clean. You may wash the wound gently with soap and water 48 hours after surgery. Gently blot or dab the wound dry. Do not take baths, use swimming pools, or use hot tubs for 10 days or until your health care provider approves. °· Change bandages (dressings) as directed by your health care provider. °· Continue your normal diet as directed by your health care provider. Eat plenty of fruits and vegetables to help prevent constipation. °· Drink enough fluids to keep your urine clear or pale yellow. This also helps prevent constipation. °· Do not drive until your health care provider says it is okay. °· Do not lift anything heavier than 10 pounds (4.5 kg) or play contact sports for 4 weeks or until your health care provider approves. °· Follow up with your health care provider as directed. Ask your health care provider when to make an appointment to have your stitches (sutures) or staples removed. °SEEK MEDICAL CARE IF:  °· You have increased bleeding coming from the incision site. °· You have blood in your stool. °· You have increasing pain in the wound. °· You see redness  or swelling in the wound. °· You have fluid (pus) coming from the wound. °· You have a fever. °· You notice a bad smell coming from the wound or dressing. °SEEK IMMEDIATE MEDICAL CARE IF:  °· You develop a rash. °· You have chest pain or shortness of breath. °· You feel lightheaded or feel faint. °Document Released: 04/08/2005 Document Revised: 07/10/2013 Document Reviewed: 05/01/2013 °ExitCare® Patient Information ©2015 [employer]. This information is not intended to replace advice given to you by your health care provider. Make sure you discuss any questions you have with your health care provider. °Ventral Hernia °A ventral hernia (also called an incisional hernia) is a hernia that occurs at the site of a previous surgical cut (incision) in the abdomen. The abdominal wall spans from your lower chest down to your pelvis. If the abdominal wall is weakened from a surgical incision, a hernia can occur. A hernia is a bulge of bowel or muscle tissue pushing out on the weakened part of the abdominal wall. Ventral hernias can get bigger from straining or lifting. °Obese and older people are at higher risk for a ventral hernia. People who develop infections after surgery or require repeat incisions at the same site on the abdomen are also at increased risk. °CAUSES  °A ventral hernia occurs because of weakness in the abdominal wall at an incision site.  °SYMPTOMS  °Common symptoms include: °· A visible bulge or lump on the abdominal wall. °· Pain or tenderness around the lump. °· Increased discomfort if you cough or make a   sudden movement. °If the hernia has blocked part of the intestine, a serious complication can occur (incarcerated or strangulated hernia). This can become a problem that requires emergency surgery because the blood flow to the blocked intestine may be cut off. Symptoms may include: °· Feeling sick to your stomach (nauseous). °· Throwing up (vomiting). °· Stomach swelling (distention) or  bloating. °· Fever. °· Rapid heartbeat. °DIAGNOSIS  °Your health care provider will take a medical history and perform a physical exam. Various tests may be ordered, such as: °· Blood tests. °· Urine tests. °· Ultrasonography. °· X-rays. °· Computed tomography (CT). °TREATMENT  °Watchful waiting may be all that is needed for a smaller hernia that does not cause symptoms. Your health care provider may recommend the use of a supportive belt (truss) that helps to keep the abdominal wall intact. For larger hernias or those that cause pain, surgery to repair the hernia is usually recommended. If a hernia becomes strangulated, emergency surgery needs to be done right away. °HOME CARE INSTRUCTIONS °· Avoid putting pressure or strain on the abdominal area. °· Avoid heavy lifting. °· Use good body positioning for physical tasks. Ask your health care provider about proper body positioning. °· Use a supportive belt as directed by your health care provider. °· Maintain a healthy weight. °· Eat foods that are high in fiber, such as whole grains, fruits, and vegetables. Fiber helps prevent difficult bowel movements (constipation). °· Drink enough fluids to keep your urine clear or pale yellow. °· Follow up with your health care provider as directed. °SEEK MEDICAL CARE IF:  °· Your hernia seems to be getting larger or more painful. °SEEK IMMEDIATE MEDICAL CARE IF:  °· You have abdominal pain that is sudden and sharp. °· Your pain becomes severe. °· You have repeated vomiting. °· You are sweating a lot. °· You notice a rapid heartbeat. °· You develop a fever. °MAKE SURE YOU:  °· Understand these instructions. °· Will watch your condition. °· Will get help right away if you are not doing well or get worse. °Document Released: 09/05/2012 Document Revised: 02/03/2014 Document Reviewed: 09/05/2012 °ExitCare® Patient Information ©2015 [employer]. This information is not intended to replace advice given to you by your health care  provider. Make sure you discuss any questions you have with your health care provider. ° °

## 2014-09-08 NOTE — Anesthesia Postprocedure Evaluation (Signed)
  Anesthesia Post-op Note  Patient: [name]  Procedure(s) Performed: Procedure(s): INCISIONAL HERNIORRHAPHY WITH MESH (N/A)  Patient Location: PACU  Anesthesia Type:General  Level of Consciousness: awake, alert , oriented and patient cooperative  Airway and Oxygen Therapy: Patient Spontanous Breathing  Post-op Pain: mild  Post-op Assessment: Post-op Vital signs reviewed, Patient's Cardiovascular Status Stable, Respiratory Function Stable, Patent Airway, No signs of Nausea or vomiting and Pain level controlled  Post-op Vital Signs: Reviewed and stable  Last Vitals:  Filed Vitals:   09/08/14 0845  BP: 104/70  Pulse: 69  Temp:   Resp: 16    Complications: No apparent anesthesia complications

## 2014-09-08 NOTE — Transfer of Care (Signed)
Immediate Anesthesia Transfer of Care Note  Patient: [name]  Procedure(s) Performed: Procedure(s): INCISIONAL HERNIORRHAPHY WITH MESH (N/A)  Patient Location: PACU  Anesthesia Type:General  Level of Consciousness: sedated and patient cooperative  Airway & Oxygen Therapy: Patient Spontanous Breathing and Patient connected to face mask oxygen  Post-op Assessment: Report given to PACU RN and Post -op Vital signs reviewed and stable  Post vital signs: Reviewed and stable  Complications: No apparent anesthesia complications

## 2014-09-08 NOTE — Interval H&P Note (Signed)
History and Physical Interval Note:  09/08/2014 7:16 AM  [name]  has presented today for surgery, with the diagnosis of incisional hernia  The various methods of treatment have been discussed with the patient and family. After consideration of risks, benefits and other options for treatment, the patient has consented to  Procedure(s): HERNIA REPAIR INCISIONAL WITH MESH (N/A) as a surgical intervention .  The patient's history has been reviewed, patient examined, no change in status, stable for surgery.  I have reviewed the patient's chart and labs.  Questions were answered to the patient's satisfaction.     Aviva Signs A

## 2014-09-08 NOTE — Anesthesia Preprocedure Evaluation (Signed)
Anesthesia Evaluation  Patient identified by MRN, date of birth, ID band Patient awake    Reviewed: Allergy & Precautions, H&P , NPO status , Patient's Chart, lab work & pertinent test results  Airway Mallampati: II  TM Distance: >3 FB     Dental  (+) Poor Dentition, Edentulous Upper, Dental Advisory Given   Pulmonary neg pulmonary ROS,  breath sounds clear to auscultation        Cardiovascular negative cardio ROS  Rhythm:Regular Rate:Normal     Neuro/Psych Seizures -, Well Controlled,  PSYCHIATRIC DISORDERS Anxiety Depression    GI/Hepatic negative GI ROS,   Endo/Other  Morbid obesity  Renal/GU      Musculoskeletal  (+) Arthritis -,   Abdominal   Peds  Hematology   Anesthesia Other Findings   Reproductive/Obstetrics                             Anesthesia Physical Anesthesia Plan  ASA: II  Anesthesia Plan: General   Post-op Pain Management:    Induction: Rapid sequence, Cricoid pressure planned and Intravenous  Airway Management Planned: Oral ETT  Additional Equipment:   Intra-op Plan:   Post-operative Plan: Extubation in OR  Informed Consent: I have reviewed the patients History and Physical, chart, labs and discussed the procedure including the risks, benefits and alternatives for the proposed anesthesia with the patient or authorized representative who has indicated his/her understanding and acceptance.     Plan Discussed with:   Anesthesia Plan Comments:         Anesthesia Quick Evaluation

## 2014-09-10 ENCOUNTER — Encounter (HOSPITAL_COMMUNITY): Payer: Self-pay | Admitting: General Surgery

## 2015-01-08 ENCOUNTER — Other Ambulatory Visit (HOSPITAL_COMMUNITY): Payer: Self-pay | Admitting: Nurse Practitioner

## 2015-01-08 DIAGNOSIS — Z1231 Encounter for screening mammogram for malignant neoplasm of breast: Secondary | ICD-10-CM

## 2015-01-12 ENCOUNTER — Ambulatory Visit (HOSPITAL_COMMUNITY)
Admission: RE | Admit: 2015-01-12 | Discharge: 2015-01-12 | Disposition: A | Discharge status: Home / Self Care | Payer: Medicaid Other | Source: Ambulatory Visit | Attending: Nurse Practitioner | Admitting: Nurse Practitioner

## 2015-01-12 DIAGNOSIS — Z1231 Encounter for screening mammogram for malignant neoplasm of breast: Secondary | ICD-10-CM | POA: Diagnosis present

## 2015-01-14 ENCOUNTER — Other Ambulatory Visit: Payer: Self-pay | Admitting: Nurse Practitioner

## 2015-01-14 DIAGNOSIS — R928 Other abnormal and inconclusive findings on diagnostic imaging of breast: Secondary | ICD-10-CM

## 2015-01-19 ENCOUNTER — Other Ambulatory Visit: Payer: Medicaid Other

## 2015-01-27 ENCOUNTER — Emergency Department (HOSPITAL_COMMUNITY)
Admission: EM | Admit: 2015-01-27 | Discharge: 2015-01-27 | Disposition: A | Discharge status: Home / Self Care | Payer: Medicaid Other | Attending: Emergency Medicine | Admitting: Emergency Medicine

## 2015-01-27 ENCOUNTER — Encounter (HOSPITAL_COMMUNITY): Payer: Self-pay | Admitting: Emergency Medicine

## 2015-01-27 ENCOUNTER — Emergency Department (HOSPITAL_COMMUNITY): Payer: Medicaid Other

## 2015-01-27 DIAGNOSIS — M1990 Unspecified osteoarthritis, unspecified site: Secondary | ICD-10-CM | POA: Insufficient documentation

## 2015-01-27 DIAGNOSIS — Z8669 Personal history of other diseases of the nervous system and sense organs: Secondary | ICD-10-CM | POA: Insufficient documentation

## 2015-01-27 DIAGNOSIS — Z79899 Other long term (current) drug therapy: Secondary | ICD-10-CM | POA: Insufficient documentation

## 2015-01-27 DIAGNOSIS — F419 Anxiety disorder, unspecified: Secondary | ICD-10-CM | POA: Insufficient documentation

## 2015-01-27 DIAGNOSIS — F329 Major depressive disorder, single episode, unspecified: Secondary | ICD-10-CM | POA: Diagnosis not present

## 2015-01-27 DIAGNOSIS — R0789 Other chest pain: Secondary | ICD-10-CM | POA: Diagnosis not present

## 2015-01-27 DIAGNOSIS — R079 Chest pain, unspecified: Secondary | ICD-10-CM | POA: Diagnosis present

## 2015-01-27 LAB — CBC
HEMATOCRIT: 43 % (ref 36.0–46.0)
Hemoglobin: 14.1 g/dL (ref 12.0–15.0)
MCH: 30.3 pg (ref 26.0–34.0)
MCHC: 32.8 g/dL (ref 30.0–36.0)
MCV: 92.3 fL (ref 78.0–100.0)
Platelets: 295 10*3/uL (ref 150–400)
RBC: 4.66 MIL/uL (ref 3.87–5.11)
RDW: 13.1 % (ref 11.5–15.5)
WBC: 6.9 10*3/uL (ref 4.0–10.5)

## 2015-01-27 LAB — BASIC METABOLIC PANEL
ANION GAP: 9 (ref 5–15)
BUN: 6 mg/dL (ref 6–23)
CHLORIDE: 108 mmol/L (ref 96–112)
CO2: 23 mmol/L (ref 19–32)
CREATININE: 0.83 mg/dL (ref 0.50–1.10)
Calcium: 8.8 mg/dL (ref 8.4–10.5)
GFR, EST NON AFRICAN AMERICAN: 86 mL/min — AB (ref >90)
GLUCOSE: 140 mg/dL — AB (ref 70–99)
Potassium: 3.1 mmol/L — ABNORMAL LOW (ref 3.5–5.1)
SODIUM: 140 mmol/L (ref 135–145)

## 2015-01-27 LAB — TROPONIN I: Troponin I: <0.03 ng/mL (ref <0.031)

## 2015-01-27 LAB — BRAIN NATRIURETIC PEPTIDE: B NATRIURETIC PEPTIDE 5: 87 pg/mL (ref 0.0–100.0)

## 2015-01-27 MED ORDER — IBUPROFEN 800 MG PO TABS: 800.0000 mg | ORAL_TABLET | Freq: Three times a day (TID) | ORAL | Status: DC | PRN

## 2015-01-27 MED ORDER — OXYCODONE-ACETAMINOPHEN 5-325 MG PO TABS: 1.0000 | ORAL_TABLET | ORAL | Status: DC | PRN

## 2015-01-27 MED ORDER — HYDROCODONE-ACETAMINOPHEN 5-325 MG PO TABS
2.0000 | ORAL_TABLET | Freq: Once | ORAL | Status: AC
  Administered 2015-01-27: 2 via ORAL
  Filled 2015-01-27: qty 2

## 2015-01-27 MED ORDER — IBUPROFEN 800 MG PO TABS
800.0000 mg | ORAL_TABLET | Freq: Once | ORAL | Status: AC
  Administered 2015-01-27: 800 mg via ORAL
  Filled 2015-01-27: qty 1

## 2015-01-27 NOTE — ED Notes (Addendum)
Patient complaining of chest pain x 2 days with radiation into left arm today. Patient also complaining of shortness of breath starting today.

## 2015-01-27 NOTE — ED Provider Notes (Signed)
TIME SEEN: 8:30 PM   CHIEF COMPLAINT: Chest pain  HPI: Pt is a 44 y.o. female with history of depression, anxiety who presents to the emergency department with left chest pain for the past 2 days it has been constant. Is worse with movement and movement of her arm. Also worse with palpation. Better with staying still. Denies associated shortness of breath despite nursing notes. No nausea, vomiting, diaphoresis, dizziness. No history of CAD. No history of PE or DVT. No recent prolonged immobilization such as long flight or hospitalization, fracture, surgery, trauma. She does have an Implanon. His tobacco use. States she did have a grandfather with an MI in his 29s. Describes her pain as stabbing.  ROS: See HPI Constitutional: no fever  Eyes: no drainage  ENT: no runny nose   Cardiovascular:   chest pain  Resp: no SOB  GI: no vomiting GU: no dysuria Integumentary: no rash  Allergy: no hives  Musculoskeletal: no leg swelling  Neurological: no slurred speech ROS otherwise negative  PAST MEDICAL HISTORY/PAST SURGICAL HISTORY:  Past Medical History  Diagnosis Date  . Anxiety   . Depression   . Arthritis   . Seizures     had 1 seizure 9-10 yrs ago; unknown etiology and no meds. No more seizures    MEDICATIONS:  Prior to Admission medications   Medication Sig Start Date End Date Taking? Authorizing Provider  etonogestrel (IMPLANON) 68 MG IMPL implant Inject 1 each into the skin once.    Historical Provider, MD  ibuprofen (ADVIL,MOTRIN) 200 MG tablet Take 400-800 mg by mouth every 6 (six) hours as needed for moderate pain.     Historical Provider, MD  oxyCODONE-acetaminophen (PERCOCET) 7.5-325 MG per tablet Take 1-2 tablets by mouth every 4 (four) hours as needed. 09/08/14   Aviva Signs Md, MD  PARoxetine (PAXIL) 40 MG tablet Take 60 mg by mouth daily.    Historical Provider, MD  risperiDONE (RISPERDAL) 1 MG tablet Take 1 mg by mouth daily.    Historical Provider, MD  traZODone (DESYREL)  100 MG tablet Take 300 mg by mouth at bedtime.    Historical Provider, MD    ALLERGIES:  Allergies  Allergen Reactions  . Naproxen Hives    SOCIAL HISTORY:  History  Substance Use Topics  . Smoking status: Never Smoker   . Smokeless tobacco: Not on file  . Alcohol Use: No    FAMILY HISTORY: History reviewed. No pertinent family history.  EXAM: BP 139/89 mmHg  Pulse 75  Temp(Src) 97.8 F (36.6 C) (Oral)  Resp 16  Ht 5\' 6"  (1.676 m)  Wt 274 lb (124.286 kg)  BMI 44.25 kg/m2  SpO2 97% CONSTITUTIONAL: Alert and oriented and responds appropriately to questions. Well-appearing; well-nourished HEAD: Normocephalic EYES: Conjunctivae clear, PERRL ENT: normal nose; no rhinorrhea; moist mucous membranes; pharynx without lesions noted NECK: Supple, no meningismus, no LAD  CARD: RRR; S1 and S2 appreciated; no murmurs, no clicks, no rubs, no gallops RESP: Normal chest excursion without splinting or tachypnea; breath sounds clear and equal bilaterally; no wheezes, no rhonchi, no rales, left chest wall stable to palpation without crepitus or ecchymosis or deformity, no hypoxia, no respiratory distress, speaking full sentences ABD/GI: Normal bowel sounds; non-distended; soft, non-tender, no rebound, no guarding BACK:  The back appears normal and is non-tender to palpation, there is no CVA tenderness EXT: Normal ROM in all joints; non-tender to palpation; no edema; normal capillary refill; no cyanosis,  tenderness or swelling    SKIN:  Normal color for age and race; warm NEURO: Moves all extremities equally PSYCH: The patient's mood and manner are appropriate. Grooming and personal hygiene are appropriate.  MEDICAL DECISION MAKING: Patient here with atypical chest pain. She has no risk factors for pulmonary embolus other than having a Implanon. She is not tachycardic, tachypneic or hypoxic. I have low suspicion for PE. Unable to reproduce her pain with palpation of her chest wall. Her EKG  shows no new ischemic changes. She has had 2 negative troponins. X-ray clear. Pain improved with ibuprofen and Vicodin. We'll discharge home with pain medication. Discussed return precautions. She verbalized understanding and is comfortable with plan.      EKG Interpretation  Date/Time:  Tuesday January 27 2015 16:34:56 EDT Ventricular Rate:  89 PR Interval:  148 QRS Duration: 82 QT Interval:  358 QTC Calculation: 435 R Axis:   124 Text Interpretation:  Normal sinus rhythm Right axis deviation Possible Anterior infarct , age undetermined T wave abnormality, consider inferior ischemia Abnormal ECG No significant change since last tracing Reconfirmed by Donnavin Vandenbrink,  DO, Saman Giddens [phone]) on 01/27/2015 8:41:46 PM          [place], DO 01/27/15 2306

## 2015-01-27 NOTE — ED Provider Notes (Deleted)
CSN: [id number]     Arrival date & time 01/27/15  1627 History   First MD Initiated Contact with Patient 01/27/15 1947     Chief Complaint  Patient presents with  . Chest Pain     (Consider location/radiation/quality/duration/timing/severity/associated sxs/prior Treatment) Patient is a 44 y.o. female presenting with chest pain. The history is provided by the patient (the pt complains of cough and sob).  Chest Pain Pain location:  L chest Pain quality: aching   Pain radiates to:  Does not radiate Pain radiates to the back: no   Pain severity:  Mild Onset quality:  Gradual Timing:  Constant Progression:  Resolved Chronicity:  New Associated symptoms: no abdominal pain, no back pain, no cough, no fatigue and no headache     Past Medical History  Diagnosis Date  . Anxiety   . Depression   . Arthritis   . Seizures     had 1 seizure 9-10 yrs ago; unknown etiology and no meds. No more seizures   Past Surgical History  Procedure Laterality Date  . Cholecystectomy    . Eye surgery Right     detatched retina  . Incisional hernia repair N/A 09/08/2014    Procedure: [name] HERNIORRHAPHY WITH MESH;  Surgeon: Jamesetta So, MD;  Location: [facility];  Service: General;  Laterality: N/A;  . Insertion of mesh N/A 09/08/2014    Procedure: INSERTION OF MESH;  Surgeon: Jamesetta So, MD;  Location: [facility];  Service: General;  Laterality: N/A;   History reviewed. No pertinent family history. History  Substance Use Topics  . Smoking status: Never Smoker   . Smokeless tobacco: Not on file  . Alcohol Use: No   OB History    No data available     Review of Systems  Constitutional: Negative for appetite change and fatigue.  HENT: Negative for congestion, ear discharge and sinus pressure.   Eyes: Negative for discharge.  Respiratory: Negative for cough.   Cardiovascular: Positive for chest pain.  Gastrointestinal: Negative for abdominal pain and diarrhea.  Genitourinary: Negative  for frequency and hematuria.  Musculoskeletal: Negative for back pain.  Skin: Negative for rash.  Neurological: Negative for seizures and headaches.  Psychiatric/Behavioral: Negative for hallucinations.      Allergies  Naproxen  Home Medications   Prior to Admission medications   Medication Sig Start Date End Date Taking? Authorizing Provider  etonogestrel (IMPLANON) 68 MG IMPL implant Inject 1 each into the skin once.    Historical Provider, MD  ibuprofen (ADVIL,MOTRIN) 200 MG tablet Take 400-800 mg by mouth every 6 (six) hours as needed for moderate pain.     Historical Provider, MD  oxyCODONE-acetaminophen (PERCOCET) 7.5-325 MG per tablet Take 1-2 tablets by mouth every 4 (four) hours as needed. 09/08/14   Aviva Signs Md, MD  PARoxetine (PAXIL) 40 MG tablet Take 60 mg by mouth daily.    Historical Provider, MD  risperiDONE (RISPERDAL) 1 MG tablet Take 1 mg by mouth daily.    Historical Provider, MD  traZODone (DESYREL) 100 MG tablet Take 300 mg by mouth at bedtime.    Historical Provider, MD   BP 139/89 mmHg  Pulse 75  Temp(Src) 97.8 F (36.6 C) (Oral)  Resp 16  Ht 5\' 6"  (1.676 m)  Wt 274 lb (124.286 kg)  BMI 44.25 kg/m2  SpO2 97% Physical Exam  Constitutional: She is oriented to person, place, and time. She appears well-developed.  HENT:  Head: Normocephalic.  Eyes: Conjunctivae and EOM  are normal. No scleral icterus.  Neck: Neck supple. No thyromegaly present.  Cardiovascular: Normal rate and regular rhythm.  Exam reveals no gallop and no friction rub.   No murmur heard. Pulmonary/Chest: No stridor. She has wheezes. She has no rales. She exhibits no tenderness.  Abdominal: She exhibits no distension. There is no tenderness. There is no rebound.  Musculoskeletal: Normal range of motion. She exhibits no edema.  Lymphadenopathy:    She has no cervical adenopathy.  Neurological: She is oriented to person, place, and time. She exhibits normal muscle tone. Coordination  normal.  Skin: No rash noted. No erythema.  Psychiatric: She has a normal mood and affect. Her behavior is normal.    ED Course  Procedures (including critical care time) Labs Review Labs Reviewed  BASIC METABOLIC PANEL - Abnormal; Notable for the following:    Potassium 3.1 (*)    Glucose, Bld 140 (*)    GFR calc non Af Amer 86 (*)    All other components within normal limits  CBC  TROPONIN I  BRAIN NATRIURETIC PEPTIDE    Imaging Review Dg Chest 2 View  01/27/2015   CLINICAL DATA:  Two day history of chest pain radiating into left arm  EXAM: CHEST  2 VIEW  COMPARISON:  Chest radiograph April 21, 2014 and chest CT April 21, 2014  FINDINGS: There is no edema or consolidation. The heart size and pulmonary vascularity are normal. No adenopathy. No bone lesions.  IMPRESSION: No edema or consolidation.   Electronically Signed   By: Lowella Grip III M.D.   On: 01/27/2015 17:53     EKG Interpretation None      MDM   Final diagnoses:  None   Bronchitis and hypokalemia.  tx with zpak and potassium and follow up with pcp    Milton Ferguson, MD 01/27/15 2005

## 2015-01-27 NOTE — Discharge Instructions (Signed)

## 2015-02-04 ENCOUNTER — Ambulatory Visit
Admission: RE | Admit: 2015-02-04 | Discharge: 2015-02-04 | Disposition: A | Discharge status: Home / Self Care | Payer: Medicaid Other | Source: Ambulatory Visit | Attending: Nurse Practitioner | Admitting: Nurse Practitioner

## 2015-02-04 DIAGNOSIS — R928 Other abnormal and inconclusive findings on diagnostic imaging of breast: Secondary | ICD-10-CM

## 2015-02-19 ENCOUNTER — Emergency Department (HOSPITAL_COMMUNITY): Payer: Medicaid Other

## 2015-02-19 ENCOUNTER — Emergency Department (HOSPITAL_COMMUNITY)
Admission: EM | Admit: 2015-02-19 | Discharge: 2015-02-19 | Disposition: A | Discharge status: Home / Self Care | Payer: Medicaid Other | Attending: Emergency Medicine | Admitting: Emergency Medicine

## 2015-02-19 ENCOUNTER — Encounter (HOSPITAL_COMMUNITY): Payer: Self-pay | Admitting: *Deleted

## 2015-02-19 DIAGNOSIS — M1990 Unspecified osteoarthritis, unspecified site: Secondary | ICD-10-CM | POA: Insufficient documentation

## 2015-02-19 DIAGNOSIS — F419 Anxiety disorder, unspecified: Secondary | ICD-10-CM | POA: Diagnosis not present

## 2015-02-19 DIAGNOSIS — R569 Unspecified convulsions: Secondary | ICD-10-CM | POA: Insufficient documentation

## 2015-02-19 DIAGNOSIS — Z3202 Encounter for pregnancy test, result negative: Secondary | ICD-10-CM | POA: Insufficient documentation

## 2015-02-19 DIAGNOSIS — W108XXA Fall (on) (from) other stairs and steps, initial encounter: Secondary | ICD-10-CM | POA: Diagnosis not present

## 2015-02-19 DIAGNOSIS — S3992XA Unspecified injury of lower back, initial encounter: Secondary | ICD-10-CM | POA: Diagnosis present

## 2015-02-19 DIAGNOSIS — I10 Essential (primary) hypertension: Secondary | ICD-10-CM | POA: Insufficient documentation

## 2015-02-19 DIAGNOSIS — Y9289 Other specified places as the place of occurrence of the external cause: Secondary | ICD-10-CM | POA: Insufficient documentation

## 2015-02-19 DIAGNOSIS — Z79899 Other long term (current) drug therapy: Secondary | ICD-10-CM | POA: Insufficient documentation

## 2015-02-19 DIAGNOSIS — Y9389 Activity, other specified: Secondary | ICD-10-CM | POA: Diagnosis not present

## 2015-02-19 DIAGNOSIS — S93402A Sprain of unspecified ligament of left ankle, initial encounter: Secondary | ICD-10-CM | POA: Diagnosis not present

## 2015-02-19 DIAGNOSIS — Y998 Other external cause status: Secondary | ICD-10-CM | POA: Diagnosis not present

## 2015-02-19 DIAGNOSIS — S300XXA Contusion of lower back and pelvis, initial encounter: Secondary | ICD-10-CM | POA: Diagnosis not present

## 2015-02-19 DIAGNOSIS — F329 Major depressive disorder, single episode, unspecified: Secondary | ICD-10-CM | POA: Insufficient documentation

## 2015-02-19 HISTORY — DX: Essential (primary) hypertension: I10

## 2015-02-19 LAB — POC URINE PREG, ED: Preg Test, Ur: NEGATIVE

## 2015-02-19 MED ORDER — HYDROCODONE-ACETAMINOPHEN 5-325 MG PO TABS
2.0000 | ORAL_TABLET | Freq: Once | ORAL | Status: AC
Start: 2015-02-19 — End: 2015-02-19
  Administered 2015-02-19: 2 via ORAL
  Filled 2015-02-19: qty 2

## 2015-02-19 MED ORDER — METHOCARBAMOL 500 MG PO TABS
1000.0000 mg | ORAL_TABLET | Freq: Once | ORAL | Status: AC
  Administered 2015-02-19: 1000 mg via ORAL
  Filled 2015-02-19: qty 2

## 2015-02-19 MED ORDER — METHOCARBAMOL 500 MG PO TABS: 500.0000 mg | ORAL_TABLET | Freq: Three times a day (TID) | ORAL | Status: DC

## 2015-02-19 MED ORDER — HYDROCODONE-ACETAMINOPHEN 5-325 MG PO TABS: 1.0000 | ORAL_TABLET | ORAL | Status: DC | PRN

## 2015-02-19 NOTE — ED Notes (Signed)
Fell down 5 steps,last night.  Pain low back and lt foot No LOC

## 2015-02-19 NOTE — Discharge Instructions (Signed)
The x-rays of your sacrum, coccyx, and ankle are all negative for fracture or dislocation. Suspect that you sustained an ankle sprain, and contusions to your coccyx, as well as bruises to several other areas. Please use Norco and Robaxin for soreness. Please call Dr. Karie Kirks tomorrow for appointment in the office as sone as possible for follow-up and for any additional management. Contusion A contusion is a deep bruise. Contusions happen when an injury causes bleeding under the skin. Signs of bruising include pain, puffiness (swelling), and discolored skin. The contusion may turn blue, purple, or yellow. HOME CARE   Put ice on the injured area.  Put ice in a plastic bag.  Place a towel between your skin and the bag.  Leave the ice on for 15-20 minutes, 03-04 times a day.  Only take medicine as told by your doctor.  Rest the injured area.  If possible, raise (elevate) the injured area to lessen puffiness. GET HELP RIGHT AWAY IF:   You have more bruising or puffiness.  You have pain that is getting worse.  Your puffiness or pain is not helped by medicine. MAKE SURE YOU:   Understand these instructions.  Will watch your condition.  Will get help right away if you are not doing well or get worse. Document Released: 03/07/2008 Document Revised: 12/12/2011 Document Reviewed: 07/25/2011 [facility] Patient Information 2015 [place], [place]. This information is not intended to replace advice given to you by your health care provider. Make sure you discuss any questions you have with your health care provider.  Ankle Sprain An ankle sprain is an injury to the strong, fibrous tissues (ligaments) that hold your ankle bones together.  HOME CARE   Put ice on your ankle for 1-2 days or as told by your doctor.  Put ice in a plastic bag.  Place a towel between your skin and the bag.  Leave the ice on for 15-20 minutes at a time, every 2 hours while you are awake.  Only take medicine as  told by your doctor.  Raise (elevate) your injured ankle above the level of your heart as much as possible for 2-3 days.  Use crutches if your doctor tells you to. Slowly put your own weight on the affected ankle. Use the crutches until you can walk without pain.  If you have a plaster splint:  Do not rest it on anything harder than a pillow for 24 hours.  Do not put weight on it.  Do not get it wet.  Take it off to shower or bathe.  If given, use an elastic wrap or support stocking for support. Take the wrap off if your toes lose feeling (numb), tingle, or turn cold or blue.  If you have an air splint:  Add or let out air to make it comfortable.  Take it off at night and to shower and bathe.  Wiggle your toes and move your ankle up and down often while you are wearing it. GET HELP IF:  You have rapidly increasing bruising or puffiness (swelling).  Your toes feel very cold.  You lose feeling in your foot.  Your medicine does not help your pain. GET HELP RIGHT AWAY IF:   Your toes lose feeling (numb) or turn blue.  You have severe pain that is increasing. MAKE SURE YOU:   Understand these instructions.  Will watch your condition.  Will get help right away if you are not doing well or get worse. Document Released: 03/07/2008 Document Revised:  02/03/2014 Document Reviewed: 04/02/2012 [employer] Patient Information 2015 [place], [place]. This information is not intended to replace advice given to you by your health care provider. Make sure you discuss any questions you have with your health care provider.

## 2015-02-19 NOTE — ED Provider Notes (Signed)
CSN: [id number]     Arrival date & time 02/19/15  1651 History   First MD Initiated Contact with Patient 02/19/15 1800     Chief Complaint  Patient presents with  . Back Pain     (Consider location/radiation/quality/duration/timing/severity/associated sxs/prior Treatment) Patient is a 44 y.o. female presenting with back pain. The history is provided by the patient.  Back Pain Pain location: sacral-coccyx  area. Quality:  Aching Pain severity:  Moderate Pain is:  Same all the time Onset quality:  Sudden Duration:  1 day Timing:  Intermittent Progression:  Worsening Chronicity:  New Context comment:   fell down 5 steps Relieved by:  Nothing Worsened by:  Movement Ineffective treatments:  None tried Associated symptoms: no bladder incontinence, no bowel incontinence and no perianal numbness   Associated symptoms comment:  Ankle pain Risk factors: no recent surgery     Past Medical History  Diagnosis Date  . Anxiety   . Depression   . Arthritis   . Seizures     had 1 seizure 9-10 yrs ago; unknown etiology and no meds. No more seizures  . Hypertension    Past Surgical History  Procedure Laterality Date  . Cholecystectomy    . Eye surgery Right     detatched retina  . Incisional hernia repair N/A 09/08/2014    Procedure: [name] HERNIORRHAPHY WITH MESH;  Surgeon: Jamesetta So, MD;  Location: [facility];  Service: General;  Laterality: N/A;  . Insertion of mesh N/A 09/08/2014    Procedure: INSERTION OF MESH;  Surgeon: Jamesetta So, MD;  Location: [facility];  Service: General;  Laterality: N/A;   History reviewed. No pertinent family history. History  Substance Use Topics  . Smoking status: Never Smoker   . Smokeless tobacco: Not on file  . Alcohol Use: No   OB History    No data available     Review of Systems  Gastrointestinal: Negative for bowel incontinence.  Genitourinary: Negative for bladder incontinence.  Musculoskeletal: Positive for back pain and  arthralgias.  Neurological: Positive for seizures.  Psychiatric/Behavioral: The patient is nervous/anxious.   All other systems reviewed and are negative.     Allergies  Naproxen  Home Medications   Prior to Admission medications   Medication Sig Start Date End Date Taking? Authorizing Provider  etonogestrel (IMPLANON) 68 MG IMPL implant Inject 1 each into the skin once.   Yes Historical Provider, MD  losartan (COZAAR) 50 MG tablet Take 50 mg by mouth daily.   Yes Historical Provider, MD  PARoxetine (PAXIL) 40 MG tablet Take 60 mg by mouth daily.   Yes Historical Provider, MD  risperiDONE (RISPERDAL) 1 MG tablet Take 1 mg by mouth daily.   Yes Historical Provider, MD  traZODone (DESYREL) 100 MG tablet Take 300 mg by mouth at bedtime.   Yes Historical Provider, MD  ibuprofen (ADVIL,MOTRIN) 800 MG tablet Take 1 tablet (800 mg total) by mouth every 8 (eight) hours as needed for mild pain. Patient not taking: Reported on 02/19/2015 01/27/15   Delice Bison Ward, DO  oxyCODONE-acetaminophen (PERCOCET/ROXICET) 5-325 MG per tablet Take 1 tablet by mouth every 4 (four) hours as needed. Patient not taking: Reported on 02/19/2015 01/27/15   Kristen N Ward, DO   BP 101/56 mmHg  Pulse 102  Temp(Src) 97.8 F (36.6 C) (Oral)  Resp 18  Ht 5\' 6"  (1.676 m)  Wt 274 lb (124.286 kg)  BMI 44.25 kg/m2  SpO2 99%  LMP  Physical Exam  Constitutional: She is oriented to person, place, and time. She appears well-developed and well-nourished.  Non-toxic appearance.  HENT:  Head: Normocephalic.  Right Ear: Tympanic membrane and external ear normal.  Left Ear: Tympanic membrane and external ear normal.  Eyes: EOM and lids are normal. Pupils are equal, round, and reactive to light.  Neck: Normal range of motion. Neck supple. Carotid bruit is not present.  Cardiovascular: Normal rate, regular rhythm, normal heart sounds, intact distal pulses and normal pulses.   Pulmonary/Chest: Breath sounds normal. No  respiratory distress.  Abdominal: Soft. Bowel sounds are normal. There is no tenderness. There is no guarding.  Musculoskeletal:       Left ankle: She exhibits decreased range of motion. She exhibits no deformity. Tenderness. Lateral malleolus tenderness found.  Lymphadenopathy:       Head (right side): No submandibular adenopathy present.       Head (left side): No submandibular adenopathy present.    She has no cervical adenopathy.  Neurological: She is alert and oriented to person, place, and time. She has normal strength. No cranial nerve deficit or sensory deficit.  Skin: Skin is warm and dry.  Psychiatric: She has a normal mood and affect. Her speech is normal.  Nursing note and vitals reviewed.   ED Course  Procedures (including critical care time) Labs Review Labs Reviewed  POC URINE PREG, ED    Imaging Review Dg Sacrum/coccyx  02/19/2015   CLINICAL DATA:  Patient fell down 5 steps last night now with pain in the sacral area. Initial encounter.  EXAM: SACRUM AND COCCYX - 2+ VIEW  COMPARISON:  CT abdomen pelvis - 05/26/2014  FINDINGS: Mild offset of the tip of the coccyx is unchanged since remote abdominal CT performed 05/2014. No definite displaced sacral or coccygeal fracture. Normal appearance of the bilateral SI joints and pubic symphysis. Limited visualization of the bilateral hips is normal. Regional soft tissues appear normal. No radiopaque foreign body.  IMPRESSION: No displaced sacral or coccygeal fracture. No radiopaque foreign body   Electronically Signed   By: Sandi Mariscal M.D.   On: 02/19/2015 18:56   Dg Ankle Complete Left  02/19/2015   CLINICAL DATA:  Status post fall down 5 steps, with left ankle pain and mild soft tissue swelling. Initial encounter.  EXAM: LEFT ANKLE COMPLETE - 3+ VIEW  COMPARISON:  None.  FINDINGS: There is no evidence of fracture or dislocation. The ankle mortise is intact; the interosseous space is within normal limits. No talar tilt or subluxation  is seen. Plantar and posterior calcaneal spurs are seen. A tiny osseous fragment at the dorsum of the talar neck is likely degenerative in nature.  The joint spaces are preserved. No significant soft tissue abnormalities are seen.  IMPRESSION: No evidence of fracture or dislocation.   Electronically Signed   By: Garald Balding M.D.   On: 02/19/2015 19:00     EKG Interpretation None      MDM  X-ray of the sacrum and coccyx is negative for any displaced fracture. X-ray of the left ankle is negative for fracture or dislocation. The examination supports bruising, and ankle sprain.  Patient will be treated with Norco and Robaxin. Patient is to see her primary physician for additional follow-up and management.    Final diagnoses:  None    **I have reviewed nursing notes, vital signs, and all appropriate lab and imaging results for this patient.[name], PA-C 02/19/15 [place], PA-C 02/19/15  1942  Noemi Chapel, MD 02/20/15 [phone]

## 2015-07-13 ENCOUNTER — Other Ambulatory Visit: Payer: Self-pay | Admitting: Nurse Practitioner

## 2015-07-13 DIAGNOSIS — N6320 Unspecified lump in the left breast, unspecified quadrant: Secondary | ICD-10-CM

## 2015-08-10 ENCOUNTER — Other Ambulatory Visit: Payer: Medicaid Other

## 2015-08-11 ENCOUNTER — Emergency Department (HOSPITAL_COMMUNITY)
Admission: EM | Admit: 2015-08-11 | Discharge: 2015-08-11 | Disposition: A | Discharge status: Home / Self Care | Payer: Medicaid Other | Attending: Emergency Medicine | Admitting: Emergency Medicine

## 2015-08-11 ENCOUNTER — Emergency Department (HOSPITAL_COMMUNITY): Payer: Medicaid Other

## 2015-08-11 ENCOUNTER — Encounter (HOSPITAL_COMMUNITY): Payer: Self-pay | Admitting: Emergency Medicine

## 2015-08-11 DIAGNOSIS — F419 Anxiety disorder, unspecified: Secondary | ICD-10-CM | POA: Insufficient documentation

## 2015-08-11 DIAGNOSIS — I10 Essential (primary) hypertension: Secondary | ICD-10-CM | POA: Insufficient documentation

## 2015-08-11 DIAGNOSIS — H81399 Other peripheral vertigo, unspecified ear: Secondary | ICD-10-CM | POA: Diagnosis not present

## 2015-08-11 DIAGNOSIS — F329 Major depressive disorder, single episode, unspecified: Secondary | ICD-10-CM | POA: Diagnosis not present

## 2015-08-11 DIAGNOSIS — Z8739 Personal history of other diseases of the musculoskeletal system and connective tissue: Secondary | ICD-10-CM | POA: Insufficient documentation

## 2015-08-11 DIAGNOSIS — R42 Dizziness and giddiness: Secondary | ICD-10-CM | POA: Diagnosis present

## 2015-08-11 DIAGNOSIS — J069 Acute upper respiratory infection, unspecified: Secondary | ICD-10-CM | POA: Diagnosis not present

## 2015-08-11 DIAGNOSIS — Z79899 Other long term (current) drug therapy: Secondary | ICD-10-CM | POA: Diagnosis not present

## 2015-08-11 LAB — CBC
HCT: 42.9 % (ref 36.0–46.0)
HEMOGLOBIN: 14.2 g/dL (ref 12.0–15.0)
MCH: 30.5 pg (ref 26.0–34.0)
MCHC: 33.1 g/dL (ref 30.0–36.0)
MCV: 92.3 fL (ref 78.0–100.0)
PLATELETS: 278 10*3/uL (ref 150–400)
RBC: 4.65 MIL/uL (ref 3.87–5.11)
RDW: 13.1 % (ref 11.5–15.5)
WBC: 8.9 10*3/uL (ref 4.0–10.5)

## 2015-08-11 LAB — BASIC METABOLIC PANEL
ANION GAP: 7 (ref 5–15)
BUN: 6 mg/dL (ref 6–20)
CALCIUM: 8.9 mg/dL (ref 8.9–10.3)
CO2: 25 mmol/L (ref 22–32)
Chloride: 110 mmol/L (ref 101–111)
Creatinine, Ser: 0.85 mg/dL (ref 0.44–1.00)
GFR calc Af Amer: >60 mL/min (ref >60)
GFR calc non Af Amer: >60 mL/min (ref >60)
GLUCOSE: 106 mg/dL — AB (ref 65–99)
POTASSIUM: 3.8 mmol/L (ref 3.5–5.1)
SODIUM: 142 mmol/L (ref 135–145)

## 2015-08-11 MED ORDER — ONDANSETRON 8 MG PO TBDP
8.0000 mg | ORAL_TABLET | Freq: Once | ORAL | Status: AC
  Administered 2015-08-11: 8 mg via ORAL
  Filled 2015-08-11: qty 1

## 2015-08-11 MED ORDER — MECLIZINE HCL 12.5 MG PO TABS
50.0000 mg | ORAL_TABLET | Freq: Once | ORAL | Status: AC
  Administered 2015-08-11: 50 mg via ORAL
  Filled 2015-08-11: qty 4

## 2015-08-11 MED ORDER — ONDANSETRON HCL 4 MG PO TABS: 4.0000 mg | ORAL_TABLET | Freq: Three times a day (TID) | ORAL | Status: DC | PRN

## 2015-08-11 MED ORDER — MECLIZINE HCL 25 MG PO TABS: 25.0000 mg | ORAL_TABLET | Freq: Three times a day (TID) | ORAL | Status: DC | PRN

## 2015-08-11 NOTE — ED Notes (Signed)
PT c/o left ear pain x2 weeks and states yesterday morning when she woke up was the dizziness onset. PT denies any weakness.

## 2015-08-11 NOTE — ED Provider Notes (Signed)
CSN: [id number]     Arrival date & time 08/11/15  1815 History   First MD Initiated Contact with Patient 08/11/15 1937     Chief Complaint  Patient presents with  . Dizziness      HPI  Pt was seen at 2000.  Per pt, c/o gradual onset and persistence of constant runny/stuffy nose and sinus congestion for the past 2 weeks. Pt states for the past 2 days she has developed "dizziness." Pt describes the dizziness as "everything is spinning around." Has been associated with nausea. Symptoms worsen with movement of her head or eyes side-to-side. Denies fevers, no rash, no CP/SOB, no vomiting/diarrhea, no abd pain, no visual changes, no focal motor weakness, no tingling/numbness in extremities, no ataxia, no slurred speech, no facial droop.  .    Past Medical History  Diagnosis Date  . Anxiety   . Depression   . Arthritis   . Seizures ([place])     had 1 seizure 9-10 yrs ago; unknown etiology and no meds. No more seizures  . Hypertension    Past Surgical History  Procedure Laterality Date  . Cholecystectomy    . Eye surgery Right     detatched retina  . Incisional hernia repair N/A 09/08/2014    Procedure: [name] HERNIORRHAPHY WITH MESH;  Surgeon: Jamesetta So, MD;  Location: [facility];  Service: General;  Laterality: N/A;  . Insertion of mesh N/A 09/08/2014    Procedure: INSERTION OF MESH;  Surgeon: Jamesetta So, MD;  Location: [facility];  Service: General;  Laterality: N/A;    Social History  Substance Use Topics  . Smoking status: Never Smoker   . Smokeless tobacco: None  . Alcohol Use: No    Review of Systems ROS: Statement: All systems negative except as marked or noted in the HPI; Constitutional: Negative for fever and chills. ; ; Eyes: Negative for eye pain, redness and discharge. ; ; ENMT: Negative for ear pain, hoarseness, sore throat. +nasal congestion, sinus pressure and rhinorrhea. ; ; Cardiovascular: Negative for chest pain, palpitations, diaphoresis, dyspnea and peripheral  edema. ; ; Respiratory: Negative for cough, wheezing and stridor. ; ; Gastrointestinal: +nausea. Negative for vomiting, diarrhea, abdominal pain, blood in stool, hematemesis, jaundice and rectal bleeding. . ; ; Genitourinary: Negative for dysuria, flank pain and hematuria. ; ; Musculoskeletal: Negative for back pain and neck pain. Negative for swelling and trauma.; ; Skin: Negative for pruritus, rash, abrasions, blisters, bruising and skin lesion.; ; Neuro: +"dizziness." Negative for headache, lightheadedness and neck stiffness. Negative for weakness, altered level of consciousness , altered mental status, extremity weakness, paresthesias, involuntary movement, seizure and syncope.      Allergies  Naproxen  Home Medications   Prior to Admission medications   Medication Sig Start Date End Date Taking? Authorizing Provider  etonogestrel (IMPLANON) 68 MG IMPL implant Inject 1 each into the skin once.   Yes Historical Provider, MD  losartan (COZAAR) 50 MG tablet Take 50 mg by mouth daily.   Yes Historical Provider, MD  PARoxetine (PAXIL) 40 MG tablet Take 60 mg by mouth daily.   Yes Historical Provider, MD  risperiDONE (RISPERDAL) 1 MG tablet Take 1 mg by mouth daily.   Yes Historical Provider, MD  traZODone (DESYREL) 100 MG tablet Take 300 mg by mouth at bedtime.   Yes Historical Provider, MD   BP 114/72 mmHg  Pulse 84  Temp(Src) 97.9 F (36.6 C) (Oral)  Resp 18  Ht 5\' 6"  (1.676 m)  Wt 250 lb (113.399 kg)  BMI 40.37 kg/m2  SpO2 87% Physical Exam  2005: Physical examination:  Nursing notes reviewed; Vital signs and O2 SAT reviewed;  Constitutional: Well developed, Well nourished, Well hydrated, In no acute distress; Head:  Normocephalic, atraumatic; Eyes: EOMI, PERRL, No scleral icterus; ENMT: TM's clear bilat. +edemetous nasal turbinates bilat with clear rhinorrhea.  Mouth and pharynx normal, Mucous membranes moist; Neck: Supple, Full range of motion, No lymphadenopathy; Cardiovascular:  Regular rate and rhythm, No murmur, rub, or gallop; Respiratory: Breath sounds clear & equal bilaterally, No wheezes.  Speaking full sentences with ease, Normal respiratory effort/excursion; Chest: Nontender, Movement normal; Abdomen: Soft, Nontender, Nondistended, Normal bowel sounds; Genitourinary: No CVA tenderness; Extremities: Pulses normal, No tenderness, No edema, No calf edema or asymmetry.; Neuro: AA&Ox3, Major CN grossly intact. Speech clear.  No facial droop.  +left horizontal end gaze fatigable nystagmus which reproduces pt's symptoms. Grips equal. Strength 5/5 equal bilat UE's and LE's.  DTR 2/4 equal bilat UE's and LE's.  No gross sensory deficits.  Normal cerebellar testing bilat UE's (finger-nose) and LE's (heel-shin). Climbs on and off stretcher easily by herself. Gait steady.; Skin: Color normal, Warm, Dry.   ED Course  Procedures (including critical care time) Labs Review   Imaging Review  I have personally reviewed and evaluated these images and lab results as part of my medical decision-making.   EKG Interpretation None      MDM  MDM Reviewed: previous chart, nursing note and vitals Reviewed previous: labs Interpretation: CT scan and labs     Results for orders placed or performed during the hospital encounter of 84/16/60  Basic metabolic panel  Result Value Ref Range   Sodium 142 135 - 145 mmol/L   Potassium 3.8 3.5 - 5.1 mmol/L   Chloride 110 101 - 111 mmol/L   CO2 25 22 - 32 mmol/L   Glucose, Bld 106 (H) 65 - 99 mg/dL   BUN 6 6 - 20 mg/dL   Creatinine, Ser 0.85 0.44 - 1.00 mg/dL   Calcium 8.9 8.9 - 10.3 mg/dL   GFR calc non Af Amer >60 >60 mL/min   GFR calc Af Amer >60 >60 mL/min   Anion gap 7 5 - 15  CBC  Result Value Ref Range   WBC 8.9 4.0 - 10.5 K/uL   RBC 4.65 3.87 - 5.11 MIL/uL   Hemoglobin 14.2 12.0 - 15.0 g/dL   HCT 42.9 36.0 - 46.0 %   MCV 92.3 78.0 - 100.0 fL   MCH 30.5 26.0 - 34.0 pg   MCHC 33.1 30.0 - 36.0 g/dL   RDW 13.1 11.5 - 15.5  %   Platelets 278 150 - 400 K/uL   Ct Head Wo Contrast 08/11/2015  CLINICAL DATA:  Patient with left ear pain and dizziness for 2 weeks. EXAM: CT HEAD WITHOUT CONTRAST TECHNIQUE: Contiguous axial images were obtained from the base of the skull through the vertex without intravenous contrast. COMPARISON:  Brain CT 06/14/2005 FINDINGS: Ventricles and sulci are appropriate for patient's age. No evidence for acute cortically based infarct, intracranial hemorrhage, mass lesion or mass-effect. Paranasal sinuses are unremarkable. Mastoid air cells are well aerated. Calvarium is intact. IMPRESSION: No acute intracranial process. Electronically Signed   By: Lovey Newcomer M.D.   On: 08/11/2015 21:35    2225:  Pt feels better after meds and wants to go home now. Pt has ambulated with steady gait, easy resps, NAD. Neuro exam remains intact and unchanged. Dx and  testing d/w pt and family.  Questions answered.  Verb understanding, agreeable to d/c home with outpt f/u.   [name], DO 08/13/15 [id number]

## 2015-08-11 NOTE — ED Notes (Signed)
Ambulated patient around nurses station. Tolerated well. No complaints.

## 2015-08-11 NOTE — Discharge Instructions (Signed)
°Emergency Department Resource Guide °1) Find a Doctor and Pay Out of Pocket °Although you won't have to find out who is covered by your insurance plan, it is a good idea to ask around and get recommendations. You will then need to call the office and see if the doctor you have chosen will accept you as a new patient and what types of options they offer for patients who are self-pay. Some doctors offer discounts or will set up payment plans for their patients who do not have insurance, but you will need to ask so you aren't surprised when you get to your appointment. ° °2) Contact Your Local Health Department °Not all health departments have doctors that can see patients for sick visits, but many do, so it is worth a call to see if yours does. If you don't know where your local health department is, you can check in your phone book. The CDC also has a tool to help you locate your state's health department, and many state websites also have listings of all of their local health departments. ° °3) Find a Walk-in Clinic °If your illness is not likely to be very severe or complicated, you may want to try a walk in clinic. These are popping up all over the country in pharmacies, drugstores, and shopping centers. They're usually staffed by nurse practitioners or physician assistants that have been trained to treat common illnesses and complaints. They're usually fairly quick and inexpensive. However, if you have serious medical issues or chronic medical problems, these are probably not your best option. ° °No Primary Care Doctor: °- Call [facility] at  832-8000 - they can help you locate a primary care doctor that  accepts your insurance, provides certain services, etc. °- Physician Referral Service- 1-800-533-3463 ° °Chronic Pain Problems: °Organization         Address  Phone   Notes  °[facility]  (336) 297-2271 Patients need to be referred by their primary care doctor.  ° °Medication  Assistance: °Organization         Address  Phone   Notes  °[place] [facility] [address] °[place], [place] [address] (336) 641-8030 --Must be a resident of [place] °-- Must have NO insurance coverage whatsoever (no Medicaid/ Medicare, etc.) °-- The pt. MUST have a primary care doctor that directs their care regularly and follows them in the community °  °[facility]  (866) 331-1348   °[employer]  (888) 892-1162   ° °Agencies that provide inexpensive medical care: °Organization         Address  Phone   Notes  °[facility]  (336) 832-8035   °[facility]    (336) 832-7272   °[facility] [address] °[place], [place] [address] (336) 832-4777   °[facility] [address], °[place] (336) 271-4999   °[facility]    (336) 373-0678   °[facility]    (336) 272-1050   °[facility] ° [address], [place] Phone:  (336) 832-4444, Fax:  (336) 832-4440 Hours of Operation:  9 am - 6 pm, M-F.  Also accepts Medicaid/Medicare and self-pay.  °[facility] ° [address], [place] Phone: (336) 832-3150, Fax: (336) 832-3151. Hours of Operation:  8:30 am - 5:30 pm, M-F.  Also accepts Medicaid and self-pay.  °[facility] [place] [address], [place] Phone: (336) 878-6027   °[facility] [address], [place], [place] (336)723-1848, Ext. 123 Mondays & Thursdays: 7-9 AM.  First 15 patients are seen on a first come, first serve basis. °  ° °Medicaid-accepting [place] Providers: ° °Organization         Address  Phone   Notes  °[facility] [address], [place] (336) 641-2100 Also accepts self-pay patients.  °[facility] [address], [place] ° (336) 856-9996   °[facility] [address], [place]  (336) 288-8857   °[facility] [address] [place] [address], [place] (336) 299-7000   °Veita Bland [address], [place]  ° (336) 373-1557 Only accepts [place] Access Medicaid patients after they have their name applied to their card.  ° °Self-Pay (no insurance) in [place]: ° °Organization         Address  Phone   Notes  °Sickle Cell Patients, [facility] [address], [place] (336) 832-1970   °[facility] [address], [place] (336) 832-4400   °[facility] ° [address], [place] (336) 992-4800   °[facility]/Dr. Osei-Bonsu ° [address] [place] [address], [place] or [address], [place] (336) 841-8500 Phone number for both [place] and [place] locations is the same.  °[facility] [address], [place] (336) 299-0000   °[facility] [address] [place] [address], [place] or [address] (336) 852-7530 °(336) 878-2260   °[facility] [address], [place] (336) 350-1642, phone; (336) 294-5005, fax Sees patients 1st and 3rd Saturday of every month.  Must not qualify for public or private insurance (i.e. Medicaid, Medicare, [place] Health Choice, Veterans' Benefits) • Household income should be no more than 200% of the poverty level •The clinic cannot treat you if you are pregnant or think you are pregnant • Sexually transmitted diseases are not treated at the clinic.  ° ° °Dental Care: °Organization         Address  Phone  Notes  °[place] [facility] [address], [place] (336) 641-6152 Accepts children up to age 21 who are enrolled in Medicaid or [place] Health Choice; pregnant women with a Medicaid card; and children who have applied for Medicaid or [place] Health Choice, but were declined, whose parents can pay a reduced fee at time of service.  °[place]  [facility] [place]  [address], [place] (336) 641-7733 Accepts children up to age 21 who are enrolled in Medicaid or [place] Health Choice; pregnant women with a Medicaid card; and children who have applied for Medicaid or  Health Choice, but were declined, whose parents can pay a reduced fee at time of service.  °[facility] ° [address], [place] (336) 641-4533 Patients are seen by appointment only. Walk-ins are not accepted. [facility] will see patients 18 years of age and older. °Monday - Tuesday (8am-5pm) °Most Wednesdays (8:30-5pm) °$30 per visit, cash only  °[facility] ° [address], [place] (336) 641-4533 Patients are seen by appointment only. Walk-ins are not accepted. [facility] will see patients 18 years of age and older. °One   Wednesday Evening (Monthly: Volunteer Based).  $30 per visit, cash only  °[facility]  (919) 537-3737 for adults; Children under age 4, call Graduate Pediatric Dentistry at (919) [phone]. Children aged 4-14, please call (919) [phone] to request a pediatric application. ° Dental services are provided in all areas of dental care including fillings, crowns and bridges, complete and partial dentures, implants, gum treatment, root canals, and extractions. Preventive care is also provided. Treatment is provided to both adults and children. °Patients are selected via a lottery and there is often a waiting list. °  °[facility] [address], °[place] ° (336) 763-8833 [email or web address] °  °[facility] [address], [place], [place] (336)723-1848, Ext. 123 Second and Fourth Thursday of each month, opens at 6:30 AM; Clinic ends at 9 AM.  Patients are seen on a first-come first-served basis, and a limited number are seen during each clinic.  ° °[facility] ° [address], [place], [place] (336) 723-7904    Eligibility Requirements °You must have lived in [place], [place], or [place] counties for at least the last three months. °  You cannot be eligible for state or federal sponsored healthcare insurance, including Veterans Administration, Medicaid, or Medicare. °  You generally cannot be eligible for healthcare insurance through your employer.  °  How to apply: °Eligibility screenings are held every Tuesday and Wednesday afternoon from 1:00 pm until 4:00 pm. You do not need an appointment for the interview!  °[facility] [address], [place], [place] 336-631-2330   °[facility]  336-342-8273   °[facility]  336-703-3100   °[facility]  336-570-6415   ° °Behavioral Health Resources in the Community: °Intensive Outpatient Programs °Organization         Address  Phone  Notes  °[facility] [address], [place], [place] 336-878-6098   °[facility] [address], [place], [place] 336-832-9800   °[facility] [address], [place], [place] ° 336-882-2125   °[facility] [address],  °[place], [place] 1-800-853-5163 or 336-641-4981   °Substance Abuse Resources °Organization         Address  Phone  Notes  °[facility]  336-882-2125   °[facility]  336-784-9470   °[facility]  336-285-9073   °[facility]  336-845-3988   °Residential & Outpatient Substance Abuse Program  1-800-659-3381   °Psychological Services °Organization         Address  Phone  Notes  °[facility]  336- 832-9600   °[facility]  336- 378-7881   °[facility] [address], [place] 1-800-853-5163 or 336-641-4981   ° °Mobile Crisis Teams °Organization         Address  Phone  Notes  °[facility], Mobile Crisis Care Unit  1-877-626-1772   °Assertive °[facility] ° [address]  [place], [place] 336-834-9664   °Sharon DeEsch [address] °[place] 336-554-5454   ° °Self-Help/Support Groups °Organization         Address  Phone             Notes  °[facility] - variety of support groups  336- 373-1402 Call for more information  °Narcotics Anonymous (NA), [facility] [address], °[place]  2 meetings at this location  ° °  Residential Treatment Programs °Organization         Address  Phone  Notes  °[facility] [address],    °[place]  1-866-801-8205   °[facility] ° [address], [place], [place] 704-293-8524   °[facility] [address], [place] 336-845-3988 Admissions: 8am-3pm M-F  °[facility] [address]    °[place], [place] 336-841-1104   °[facility] [address], [place], [place] 336-379-7146   °[facility] [address]  °[place], [place] 336-285-9073   °[facility] - Intensive Outpatient [address], [place], [place] 336-852-3033   °[facility] [address]  °[place], [place] 1-877-615-2722 or 336-784-9470   °[facility] [address] [place], [place] 336-227-7417 Accepts Medicaid  °[facility] [address]  ° [place] 1-800-659-3381 Substance Abuse/Addiction Treatment  ° °[place] Behavioral Health Resources °Organization         Address  Phone  Notes  °[facility]  (888) 581-9988   °Julie Brannon, PhD [address] [place], [place]   (336) 349-5553 or (336) 951-0000   °[facility]   [address] °[place], [place] (336) 349-4454   °[facility] [address], [place], [place] (336) 342-8316 Insurance/Medicaid/sponsorship through [facility]  °[facility] [address]                                    [place], [place] (336) 342-8316 Therapy/tele-psych/case    °[facility] [address]  ° [place], [place] (336) 349-2233    °Dr. Arfeen  (336) 349-4544   °[facility] [place]  [employer] [place] [facility] 1) [address], [place] °2) [address], [place] °3)  [address], [place] (336) 349-3220 °(336) 342-7768 ° °(336) 342-8140   °[place] [facility] (336) 342-1394 or (336) 342-3537 (After Hours)    ° ° °Take the prescriptions as directed.  Call your regular medical doctor tomorrow to schedule a follow up appointment within the next 2 days. Call the ENT doctor tomorrow to schedule a follow up appointment within the next week.  Return to the Emergency Department immediately sooner if worsening.  ° °

## 2015-09-15 ENCOUNTER — Other Ambulatory Visit: Payer: Self-pay | Admitting: Family Medicine

## 2015-09-15 DIAGNOSIS — N6320 Unspecified lump in the left breast, unspecified quadrant: Secondary | ICD-10-CM

## 2015-12-17 ENCOUNTER — Ambulatory Visit (INDEPENDENT_AMBULATORY_CARE_PROVIDER_SITE_OTHER): Payer: Medicaid Other | Admitting: Otolaryngology

## 2015-12-17 DIAGNOSIS — H903 Sensorineural hearing loss, bilateral: Secondary | ICD-10-CM

## 2015-12-17 DIAGNOSIS — H6123 Impacted cerumen, bilateral: Secondary | ICD-10-CM | POA: Diagnosis not present

## 2015-12-17 DIAGNOSIS — R42 Dizziness and giddiness: Secondary | ICD-10-CM | POA: Diagnosis not present

## 2015-12-18 ENCOUNTER — Other Ambulatory Visit (HOSPITAL_COMMUNITY): Payer: Self-pay | Admitting: Respiratory Therapy

## 2015-12-18 DIAGNOSIS — G4733 Obstructive sleep apnea (adult) (pediatric): Secondary | ICD-10-CM

## 2016-01-21 ENCOUNTER — Other Ambulatory Visit (HOSPITAL_COMMUNITY): Payer: Self-pay | Admitting: Respiratory Therapy

## 2016-01-21 DIAGNOSIS — G4733 Obstructive sleep apnea (adult) (pediatric): Secondary | ICD-10-CM

## 2016-02-24 ENCOUNTER — Emergency Department (HOSPITAL_COMMUNITY)
Admission: EM | Admit: 2016-02-24 | Discharge: 2016-02-24 | Disposition: A | Discharge status: Home / Self Care | Payer: Medicaid Other | Attending: Emergency Medicine | Admitting: Emergency Medicine

## 2016-02-24 ENCOUNTER — Encounter (HOSPITAL_COMMUNITY): Payer: Self-pay | Admitting: Emergency Medicine

## 2016-02-24 DIAGNOSIS — Y9389 Activity, other specified: Secondary | ICD-10-CM | POA: Diagnosis not present

## 2016-02-24 DIAGNOSIS — K0889 Other specified disorders of teeth and supporting structures: Secondary | ICD-10-CM | POA: Diagnosis present

## 2016-02-24 DIAGNOSIS — Y999 Unspecified external cause status: Secondary | ICD-10-CM | POA: Diagnosis not present

## 2016-02-24 DIAGNOSIS — I10 Essential (primary) hypertension: Secondary | ICD-10-CM | POA: Insufficient documentation

## 2016-02-24 DIAGNOSIS — F329 Major depressive disorder, single episode, unspecified: Secondary | ICD-10-CM | POA: Diagnosis not present

## 2016-02-24 DIAGNOSIS — Y929 Unspecified place or not applicable: Secondary | ICD-10-CM | POA: Insufficient documentation

## 2016-02-24 DIAGNOSIS — M1990 Unspecified osteoarthritis, unspecified site: Secondary | ICD-10-CM | POA: Insufficient documentation

## 2016-02-24 DIAGNOSIS — X58XXXA Exposure to other specified factors, initial encounter: Secondary | ICD-10-CM | POA: Diagnosis not present

## 2016-02-24 DIAGNOSIS — S025XXA Fracture of tooth (traumatic), initial encounter for closed fracture: Secondary | ICD-10-CM | POA: Diagnosis not present

## 2016-02-24 MED ORDER — TRAMADOL HCL 50 MG PO TABS: 50.0000 mg | ORAL_TABLET | Freq: Four times a day (QID) | ORAL | Status: DC | PRN

## 2016-02-24 MED ORDER — ONDANSETRON HCL 4 MG PO TABS
4.0000 mg | ORAL_TABLET | Freq: Once | ORAL | Status: AC
  Administered 2016-02-24: 4 mg via ORAL
  Filled 2016-02-24: qty 1

## 2016-02-24 MED ORDER — AMOXICILLIN 500 MG PO CAPS: 500.0000 mg | ORAL_CAPSULE | Freq: Three times a day (TID) | ORAL | Status: DC

## 2016-02-24 MED ORDER — AMOXICILLIN 250 MG PO CAPS
500.0000 mg | ORAL_CAPSULE | Freq: Once | ORAL | Status: AC
  Administered 2016-02-24: 500 mg via ORAL
  Filled 2016-02-24: qty 2

## 2016-02-24 MED ORDER — TRAMADOL HCL 50 MG PO TABS
100.0000 mg | ORAL_TABLET | Freq: Once | ORAL | Status: AC
  Administered 2016-02-24: 100 mg via ORAL
  Filled 2016-02-24: qty 2

## 2016-02-24 NOTE — ED Notes (Signed)
Pt states she was eating and broke left lower front tooth. Having pain

## 2016-02-24 NOTE — ED Provider Notes (Signed)
CSN: [id number]     Arrival date & time 02/24/16  1924 History   First MD Initiated Contact with Patient 02/24/16 2010     Chief Complaint  Patient presents with  . Dental Pain     (Consider location/radiation/quality/duration/timing/severity/associated sxs/prior Treatment) HPI Comments: Patient is a 45 year old female who presents to the emergency department with a broken tooth. The patient states she's had cavities of her lower teeth for quite some time. She states she was eating a piece of chicken when this tooth broke. She had immediate pain. She says that the pain was so severe she thought she might pass out or throw up. She presents to the emergency department now for assistance. She tried applying ice, but could not tolerate ice or heat to the area.  The history is provided by the patient.    Past Medical History  Diagnosis Date  . Anxiety   . Depression   . Arthritis   . Seizures ([place])     had 1 seizure 9-10 yrs ago; unknown etiology and no meds. No more seizures  . Hypertension    Past Surgical History  Procedure Laterality Date  . Cholecystectomy    . Eye surgery Right     detatched retina  . Incisional hernia repair N/A 09/08/2014    Procedure: [name] HERNIORRHAPHY WITH MESH;  Surgeon: Jamesetta So, MD;  Location: [facility];  Service: General;  Laterality: N/A;  . Insertion of mesh N/A 09/08/2014    Procedure: INSERTION OF MESH;  Surgeon: Jamesetta So, MD;  Location: [facility];  Service: General;  Laterality: N/A;   History reviewed. No pertinent family history. Social History  Substance Use Topics  . Smoking status: Never Smoker   . Smokeless tobacco: None  . Alcohol Use: No   OB History    No data available     Review of Systems  HENT: Positive for dental problem.   Musculoskeletal: Positive for arthralgias.  Psychiatric/Behavioral: The patient is nervous/anxious.   All other systems reviewed and are negative.     Allergies  Naproxen  Home  Medications   Prior to Admission medications   Medication Sig Start Date End Date Taking? Authorizing Provider  etonogestrel (IMPLANON) 68 MG IMPL implant Inject 1 each into the skin once.    Historical Provider, MD  losartan (COZAAR) 50 MG tablet Take 50 mg by mouth daily.    Historical Provider, MD  meclizine (ANTIVERT) 25 MG tablet Take 1 tablet (25 mg total) by mouth 3 (three) times daily as needed for dizziness. 08/11/15   [name], DO  ondansetron (ZOFRAN) 4 MG tablet Take 1 tablet (4 mg total) by mouth every 8 (eight) hours as needed for nausea or vomiting. 08/11/15   [name], DO  PARoxetine (PAXIL) 40 MG tablet Take 60 mg by mouth daily.    Historical Provider, MD  risperiDONE (RISPERDAL) 1 MG tablet Take 1 mg by mouth daily.    Historical Provider, MD  traZODone (DESYREL) 100 MG tablet Take 300 mg by mouth at bedtime.    Historical Provider, MD   BP 115/71 mmHg  Pulse 95  Temp(Src) 98.1 F (36.7 C) (Oral)  Resp 20  Ht 5\' 6"  (1.676 m)  Wt 121.564 kg  BMI 43.28 kg/m2  SpO2 99% Physical Exam  Constitutional: She is oriented to person, place, and time. She appears well-developed and well-nourished.  Non-toxic appearance.  HENT:  Head: Normocephalic.  Right Ear: Tympanic membrane and external ear normal.  Left  Ear: Tympanic membrane and external ear normal.  Left lower incisor #24 is broken to near the gum line. I do not see the pulp, but the patient has a lot of decay involving the tooth. There is swelling about the gum. The airway is patent. There is no swelling under the tongue.  Eyes: EOM and lids are normal. Pupils are equal, round, and reactive to light.  Neck: Normal range of motion. Neck supple. Carotid bruit is not present.  Cardiovascular: Normal rate, regular rhythm, normal heart sounds, intact distal pulses and normal pulses.   Pulmonary/Chest: Breath sounds normal. No respiratory distress.  Abdominal: Soft. Bowel sounds are normal. There is no  tenderness. There is no guarding.  Musculoskeletal: Normal range of motion.  Lymphadenopathy:       Head (right side): No submandibular adenopathy present.       Head (left side): No submandibular adenopathy present.    She has no cervical adenopathy.  Neurological: She is alert and oriented to person, place, and time. She has normal strength. No cranial nerve deficit or sensory deficit.  Skin: Skin is warm and dry.  Psychiatric: She has a normal mood and affect. Her speech is normal.  Nursing note and vitals reviewed.   ED Course  .Nerve Block Date/Time: 02/24/2016 8:51 PM Performed by: [name] Authorized by: [name] Consent: Verbal consent obtained. Risks and benefits: risks, benefits and alternatives were discussed Consent given by: patient Patient understanding: patient states understanding of the procedure being performed Patient identity confirmed: arm band Time out: Immediately prior to procedure a "time out" was called to verify the correct patient, procedure, equipment, support staff and site/side marked as required. Indications: pain relief Body area: face/mouth Nerve: anterior superior alveolar Patient sedated: no Preparation: Patient was prepped and draped in the usual sterile fashion. Patient position: supine Needle gauge: 25 G Location technique: anatomical landmarks Local anesthetic: lidocaine 2% with epinephrine Anesthetic total: 3 ml Outcome: pain improved Patient tolerance: Patient tolerated the procedure well with no immediate complications   (including critical care time) Labs Review Labs Reviewed - No data to display  Imaging Review No results found. I have personally reviewed and evaluated these images and lab results as part of my medical decision-making.   EKG Interpretation None      MDM  Vital signs within normal limits. The patient has a contusion to the left lower jaw area. A dental block was applied. The patient has  significant improvement in pain. The patient will be treated with Amoxil 3 times daily, and Ultram every 6 hours. The patient is allergic to NSAIDs. I strongly suggested that the patient see a dentist as soon as possible.    Final diagnoses:  None    *I have reviewed nursing notes, vital signs, and all appropriate lab and imaging results for this patient.**    [name], PA-C 02/24/16 2056  [name], PA-C 02/24/16 2057A lot of  [name], PA-C 02/24/16 2106  Milton Ferguson, MD 02/24/16 [phone]

## 2016-02-24 NOTE — Discharge Instructions (Signed)
Please go to your pharmacy and requests artificial filling to place on the broken tooth until you can see the dentist. Please use Amoxil 3 times daily with food. Use Ultram every 6 hours as needed for pain. Ultram may cause drowsiness, please do not drink alcohol, drive a vehicle, operating machinery, or dissipated in activities requiring concentration when taking this medication. It is important that you see a dentist systems possible. Please see Dr. Karie Kirks for assistance with any pain control or management. Tooth Injuries Tooth injuries (tooth trauma) include cracked or broken teeth (fractures), teeth that have been moved out of place or dislodged (luxations), and knocked-out teeth (avulsions). A tooth injury often needs to be treated quickly to save the tooth. If it is not possible to save a tooth after an injury, the tooth may need to be removed (extracted). Tooth injuries may be caused by any force that is strong enough to chip, break, dislodge, or knock out a tooth. HOME CARE  Take medicines only as told by your dentist or doctor.  Keep all follow-up visits as told by your dentist or doctor. This is important.  Do not eat or chew on very hard objects. These include ice cubes, pens, pencils, hard candy, and popcorn kernels.  Do not clench or grind your teeth. Tell your dentist or doctor if you grind your teeth while you sleep.  Apply ice to your mouth near the injured tooth as told by your dentist or doctor.  Rinse your mouth with salt water as told by your dentist or doctor.  Do not use your teeth to open packages.  Always wear mouth protection when you play contact sports. GET HELP IF:  You continue to have tooth pain after a tooth injury.  Your tooth hurts or feels bad (is sensitive) because of something hot or cold.  You develop swelling near your injured tooth.  You have a fever.  You are not able to open your jaw.  You are drooling and it is getting worse.   This  information is not intended to replace advice given to you by your health care provider. Make sure you discuss any questions you have with your health care provider.   Document Released: 03/09/2010 Document Revised: 02/03/2015 Document Reviewed: 09/15/2014 Elsevier Interactive Patient Education 2016 [facility] [employer] is a great source of information about community services available.  Access by dialing 2-1-1 from anywhere in [place], or by website -  CustodianSupply.fi.   Other Local Resources (Updated 10/2015)  Dental  Care   Services    Phone Number and Address  Cost  [facility] For children 66 - 32 years of age:   Cleaning  Tooth brushing/flossing instruction  Sealants, fillings, crowns  Extractions  Emergency treatment  [phone] [address], [place] [address] Charges based on family income.  Medicaid and some insurance plans accepted.     [facility] - [facility], fillings, crowns  Extractions  Emergency treatment [phone] [address] [place], [place]  Pregnant women 39 years of age or older with a Medicaid card  [facility] - [place]  Cleaning  Sealants, fillings, crowns  Extractions  Emergency treatment [phone] [address] [place], [place] Pregnant women 34 years of age or older with a Medicaid card  [facility] For children 0 -  64 years of age:   Cleaning  Tooth brushing/flossing instruction  Sealants, fillings, crowns  Extractions  Emergency treatment Limited orthodontic services for patients with Medicaid [phone] [address], [place] [address] Medicaid and [facility] Health Choice cover for children up to age 63 and pregnant women.  Parents of children up to age 34 without  Medicaid pay a reduced fee at time of service.  [place] For children 47 - 88 years of age:   Cleaning  Tooth brushing/flossing instruction  Sealants, fillings, crowns  Extractions  Emergency treatment Limited orthodontic services for patients with Medicaid [phone] [place], [place].  Medicaid and [place] Health Choice cover for children up to age 16 and pregnant women.  Parents of children up to age 7 without Medicaid pay a reduced fee.  [facility]  Sealants, fillings, crowns  Extractions  Hours: Tuesdays and Thursdays, 4:15 - 8 pm [phone] [address], [place], [place] [address] Services free of charge to [facility] residents ages 18-64 who do not have health insurance, Medicare, [place], or [place] benefits and fall within federal poverty guidelines  [place] care in addition to primary medical care, nutritional counseling, and pharmacy:  Engineer, drilling, fillings, crowns  Extractions                  [phone] [facility], [place], [place], [place], [place], [place], [place], [place] [facility] [place], [place], [place], most insurance.  Also provides services available to all with fees adjusted based on ability to pay.    [facility]  Cleaning  Tooth brushing/flossing instruction  Sealants, fillings, crowns  Extractions  Emergency treatment Hours: Tuesdays, Thursdays, and Fridays from 8 am to 5 pm by appointment only. [phone] [address], [place] [address] [facility] residents with Medicaid (depending on eligibility) and children with [facility] Health Choice - call for more information.  [facility]  Extractions only  Hours: 2nd and 4th Thursday of each month from 6:30 am - 9 am.   [phone] ext. [place], [place] [address] Ages 72 and older only.  Patients are seen on a first come, first served basis.  [employer]  [employer]  Extractions  Orthodontics  Endodontics  Implants/Crowns/Bridges  Complete and partial dentures [phone] [place], [place] Patients must complete an application for services.  There is often a waiting list.

## 2016-06-21 ENCOUNTER — Other Ambulatory Visit: Payer: Self-pay | Admitting: Nurse Practitioner

## 2016-06-21 DIAGNOSIS — N6320 Unspecified lump in the left breast, unspecified quadrant: Secondary | ICD-10-CM

## 2016-06-28 ENCOUNTER — Inpatient Hospital Stay: Admission: RE | Admit: 2016-06-28 | Payer: Medicaid Other | Source: Ambulatory Visit

## 2016-06-28 ENCOUNTER — Other Ambulatory Visit: Payer: Medicaid Other

## 2016-07-07 ENCOUNTER — Encounter (HOSPITAL_COMMUNITY): Payer: Self-pay

## 2016-07-07 ENCOUNTER — Emergency Department (HOSPITAL_COMMUNITY)
Admission: EM | Admit: 2016-07-07 | Discharge: 2016-07-07 | Disposition: A | Discharge status: Home / Self Care | Payer: Medicaid Other | Attending: Emergency Medicine | Admitting: Emergency Medicine

## 2016-07-07 DIAGNOSIS — Z79899 Other long term (current) drug therapy: Secondary | ICD-10-CM | POA: Insufficient documentation

## 2016-07-07 DIAGNOSIS — R21 Rash and other nonspecific skin eruption: Secondary | ICD-10-CM | POA: Insufficient documentation

## 2016-07-07 MED ORDER — PREDNISONE 50 MG PO TABS: ORAL_TABLET | ORAL | 0 refills | Status: DC

## 2016-07-07 NOTE — ED Provider Notes (Signed)
[facility] DEPT Provider Note   CSN: [id number] Arrival date & time: 07/07/16  1212     History   Chief Complaint Chief Complaint  Patient presents with  . Rash    HPI [name] is a 45 y.o. female.  The history is provided by the patient.  Rash   This is a new problem. The current episode started more than 1 week ago. The problem has been gradually worsening. The problem is associated with nothing. There has been no fever. The patient is experiencing no pain. The pain has been constant since onset. Associated symptoms include itching. She has tried anti-itch cream for the symptoms. The treatment provided no relief.   Patient reports rash to body for past 2 weeks She was seen previously, placed on triamcinolone but now rash is worsening No other new meds No tick bites or insect stings No shortness of breath No tongue/lip swelling reported  Past Medical History:  Diagnosis Date  . Anxiety   . Arthritis   . Depression   . Hypertension   . Seizures ([place])    had 1 seizure 9-10 yrs ago; unknown etiology and no meds. No more seizures    There are no active problems to display for this patient.   Past Surgical History:  Procedure Laterality Date  . CHOLECYSTECTOMY    . EYE SURGERY Right    detatched retina  . INCISIONAL HERNIA REPAIR N/A 09/08/2014   Procedure: [name] HERNIORRHAPHY WITH MESH;  Surgeon: Jamesetta So, MD;  Location: [facility];  Service: General;  Laterality: N/A;  . INSERTION OF MESH N/A 09/08/2014   Procedure: INSERTION OF MESH;  Surgeon: Jamesetta So, MD;  Location: [facility];  Service: General;  Laterality: N/A;    OB History    No data available       Home Medications    Prior to Admission medications   Medication Sig Start Date End Date Taking? Authorizing Provider  etonogestrel (IMPLANON) 68 MG IMPL implant Inject 1 each into the skin once.    Historical Provider, MD  losartan (COZAAR) 50 MG tablet Take 50 mg by mouth daily.     Historical Provider, MD  meclizine (ANTIVERT) 25 MG tablet Take 1 tablet (25 mg total) by mouth 3 (three) times daily as needed for dizziness. 08/11/15   [name], DO  ondansetron (ZOFRAN) 4 MG tablet Take 1 tablet (4 mg total) by mouth every 8 (eight) hours as needed for nausea or vomiting. 08/11/15   [name], DO  PARoxetine (PAXIL) 40 MG tablet Take 60 mg by mouth daily.    Historical Provider, MD  predniSONE (DELTASONE) 50 MG tablet One tablet PO daily for 5 days 07/07/16   Ripley Fraise, MD  risperiDONE (RISPERDAL) 1 MG tablet Take 1 mg by mouth daily.    Historical Provider, MD  traMADol (ULTRAM) 50 MG tablet Take 1 tablet (50 mg total) by mouth every 6 (six) hours as needed. 02/24/16   [name], PA-C  traZODone (DESYREL) 100 MG tablet Take 300 mg by mouth at bedtime.    Historical Provider, MD    Family History No family history on file.  Social History Social History  Substance Use Topics  . Smoking status: Never Smoker  . Smokeless tobacco: Never Used  . Alcohol use No     Allergies   Naproxen   Review of Systems Review of Systems  Constitutional: Negative for fever.  Respiratory: Negative for shortness of breath.   Skin:  Positive for itching and rash.     Physical Exam Updated Vital Signs BP 128/84   Pulse 100   Temp 98.2 F (36.8 C) (Oral)   Resp 18   Ht 5\' 6"  (1.676 m)   Wt 118.4 kg   SpO2 97%   BMI 42.13 kg/m   Physical Exam  CONSTITUTIONAL: Well developed/well nourished HEAD: Normocephalic/atraumatic EYES: EOMI/PERRL ENMT: Mucous membranes moist, poor dentition, no angioedema noted NECK: supple no meningeal signs CV: S1/S2 noted, no murmurs/rubs/gallops noted LUNGS: Lungs are clear to auscultation bilaterally, no apparent distress ABDOMEN: soft, nontender NEURO: Pt is awake/alert/appropriate, moves all extremitiesx4.  EXTREMITIES: pulses normal/equal, full ROM SKIN: scattered urticaria to abdomen/back No petechiae.  No rash  on palms PSYCH: no abnormalities of mood noted, alert and oriented to situation  ED Treatments / Results  Labs (all labs ordered are listed, but only abnormal results are displayed) Labs Reviewed - No data to display  EKG  EKG Interpretation None       Radiology No results found.  Procedures Procedures (including critical care time)  Medications Ordered in ED Medications - No data to display   Initial Impression / Assessment and Plan / ED Course  I have reviewed the triage vital signs and the nursing notes.    Clinical Course    Start prednisone Continue benadryl F/u with PCP in a week   Final Clinical Impressions(s) / ED Diagnoses   Final diagnoses:  Rash    New Prescriptions Discharge Medication List as of 07/07/2016  1:44 PM    START taking these medications   Details  predniSONE (DELTASONE) 50 MG tablet One tablet PO daily for 5 days, Print         Ripley Fraise, MD 07/07/16 1411

## 2016-07-07 NOTE — ED Triage Notes (Signed)
Pt reports itchy, raised, red rash to arms x 2 weeks ago and was put on triamcinolone cream by the [facility].  Reports rash has spread to legs and back as well.

## 2016-08-23 ENCOUNTER — Telehealth: Payer: Self-pay | Admitting: Physician Assistant

## 2016-09-25 ENCOUNTER — Encounter (HOSPITAL_COMMUNITY): Payer: Self-pay | Admitting: Emergency Medicine

## 2016-09-25 ENCOUNTER — Emergency Department (HOSPITAL_COMMUNITY)
Admission: EM | Admit: 2016-09-25 | Discharge: 2016-09-25 | Disposition: A | Discharge status: Home / Self Care | Payer: Medicaid Other | Attending: Emergency Medicine | Admitting: Emergency Medicine

## 2016-09-25 DIAGNOSIS — Y929 Unspecified place or not applicable: Secondary | ICD-10-CM | POA: Insufficient documentation

## 2016-09-25 DIAGNOSIS — S80862A Insect bite (nonvenomous), left lower leg, initial encounter: Secondary | ICD-10-CM | POA: Insufficient documentation

## 2016-09-25 DIAGNOSIS — Y939 Activity, unspecified: Secondary | ICD-10-CM | POA: Diagnosis not present

## 2016-09-25 DIAGNOSIS — I10 Essential (primary) hypertension: Secondary | ICD-10-CM | POA: Diagnosis not present

## 2016-09-25 DIAGNOSIS — W57XXXA Bitten or stung by nonvenomous insect and other nonvenomous arthropods, initial encounter: Secondary | ICD-10-CM | POA: Insufficient documentation

## 2016-09-25 DIAGNOSIS — Y999 Unspecified external cause status: Secondary | ICD-10-CM | POA: Diagnosis not present

## 2016-09-25 DIAGNOSIS — Z79899 Other long term (current) drug therapy: Secondary | ICD-10-CM | POA: Insufficient documentation

## 2016-09-25 DIAGNOSIS — S30861A Insect bite (nonvenomous) of abdominal wall, initial encounter: Secondary | ICD-10-CM | POA: Diagnosis present

## 2016-09-25 DIAGNOSIS — S80861A Insect bite (nonvenomous), right lower leg, initial encounter: Secondary | ICD-10-CM | POA: Diagnosis not present

## 2016-09-25 MED ORDER — PREDNISONE 20 MG PO TABS
40.0000 mg | ORAL_TABLET | Freq: Once | ORAL | Status: AC
  Administered 2016-09-25: 40 mg via ORAL
  Filled 2016-09-25: qty 2

## 2016-09-25 MED ORDER — TRIAMCINOLONE ACETONIDE 0.1 % EX CREA: 1.0000 "application " | TOPICAL_CREAM | Freq: Two times a day (BID) | CUTANEOUS | 1 refills | Status: DC

## 2016-09-25 MED ORDER — HYDROXYZINE PAMOATE 25 MG PO CAPS: 25.0000 mg | ORAL_CAPSULE | Freq: Three times a day (TID) | ORAL | 0 refills | Status: DC | PRN

## 2016-09-25 MED ORDER — HYDROXYZINE HCL 25 MG PO TABS
25.0000 mg | ORAL_TABLET | Freq: Once | ORAL | Status: AC
  Administered 2016-09-25: 25 mg via ORAL
  Filled 2016-09-25: qty 1

## 2016-09-25 MED ORDER — DEXAMETHASONE 4 MG PO TABS: 4.0000 mg | ORAL_TABLET | Freq: Two times a day (BID) | ORAL | 0 refills | Status: DC

## 2016-09-25 NOTE — ED Provider Notes (Signed)
[facility] DEPT Provider Note   CSN: [id number] Arrival date & time: 09/25/16  1213  By signing my name below, I, [name], attest that this documentation has been prepared under the direction and in the presence of Theresa Kocher, PA-C. Electronically Signed: Gwenlyn Benson, ED Scribe. 09/25/16. 12:46 PM.  History   Chief Complaint Chief Complaint  Patient presents with  . Possible bed bugs   The history is provided by the patient. No language interpreter was used.   HPI Comments: [name] is a 45 y.o. female who presents to the Emergency Department complaining of gradual onset, pruritic and erythematous rash to her back, abdomen, neck, and bilateral arms and legs onset 5 weeks PTA. Pt was treated 2-3 weeks prior for scabies x2 with Prednisone. Pt has purchased another mattress since ED visit but symptoms have not resolved. She notes her husband has not experienced similar symptoms. Pt was seen by PCP who did not think rash was scabies. Pt has sprayed her room once since finding bed bugs. She denies medical conditions that could cause a rash.  She also complains of constant, moderate right shoulder pain s/p fall 2 weeks PTA. She states she fell down approximately.   Pt denies fever, chills.  Past Medical History:  Diagnosis Date  . Anxiety   . Arthritis   . Depression   . Hypertension   . Seizures ([place])    had 1 seizure 9-10 yrs ago; unknown etiology and no meds. No more seizures   There are no active problems to display for this patient.  Past Surgical History:  Procedure Laterality Date  . CHOLECYSTECTOMY    . EYE SURGERY Right    detatched retina  . INCISIONAL HERNIA REPAIR N/A 09/08/2014   Procedure: [name] HERNIORRHAPHY WITH MESH;  Surgeon: Jamesetta So, MD;  Location: [facility];  Service: General;  Laterality: N/A;  . INSERTION OF MESH N/A 09/08/2014   Procedure: INSERTION OF MESH;  Surgeon: Jamesetta So, MD;  Location: [facility];  Service: General;   Laterality: N/A;    OB History    No data available      Home Medications    Prior to Admission medications   Medication Sig Start Date End Date Taking? Authorizing Provider  etonogestrel (IMPLANON) 68 MG IMPL implant Inject 1 each into the skin once.    Historical Provider, MD  losartan (COZAAR) 50 MG tablet Take 50 mg by mouth daily.    Historical Provider, MD  meclizine (ANTIVERT) 25 MG tablet Take 1 tablet (25 mg total) by mouth 3 (three) times daily as needed for dizziness. 08/11/15   [name], DO  ondansetron (ZOFRAN) 4 MG tablet Take 1 tablet (4 mg total) by mouth every 8 (eight) hours as needed for nausea or vomiting. 08/11/15   [name], DO  PARoxetine (PAXIL) 40 MG tablet Take 60 mg by mouth daily.    Historical Provider, MD  predniSONE (DELTASONE) 50 MG tablet One tablet PO daily for 5 days 07/07/16   Ripley Fraise, MD  risperiDONE (RISPERDAL) 1 MG tablet Take 1 mg by mouth daily.    Historical Provider, MD  traMADol (ULTRAM) 50 MG tablet Take 1 tablet (50 mg total) by mouth every 6 (six) hours as needed. 02/24/16   Theresa Kocher, PA-C  traZODone (DESYREL) 100 MG tablet Take 300 mg by mouth at bedtime.    Historical Provider, MD    Family History History reviewed. No pertinent family history.  Social History Social History  Substance Use Topics  . Smoking status: Never Smoker  . Smokeless tobacco: Never Used  . Alcohol use No   Allergies   Naproxen  Review of Systems Review of Systems  Constitutional: Negative for chills and fever.  Musculoskeletal: Positive for arthralgias.  Skin: Positive for rash.  All other systems reviewed and are negative.  Physical Exam Updated Vital Signs BP 119/91 (BP Location: Right Wrist)   Pulse 67   Temp 97.5 F (36.4 C) (Oral)   Resp 16   Ht 5\' 6"  (1.676 m)   Wt 264 lb (119.7 kg)   SpO2 98%   BMI 42.61 kg/m   Physical Exam  Constitutional: She is oriented to person, place, and time. She appears  well-developed and well-nourished. She is active. No distress.  HENT:  Head: Normocephalic and atraumatic.  Eyes: Conjunctivae are normal.  Neck:  No cervical lymphadenopathy  Cardiovascular: Normal rate, regular rhythm and normal heart sounds.  Exam reveals no gallop and no friction rub.   No murmur heard. Pulmonary/Chest: Effort normal and breath sounds normal. No respiratory distress. She has no wheezes. She has no rales.  Abdominal: She exhibits no distension.  Musculoskeletal: Normal range of motion.  No dislocation or bruising to right shoulder FROM of right elbow, wrist and fingers   Neurological: She is alert and oriented to person, place, and time.  Skin: Skin is warm and dry.  No lesions on the web spaces multiple maculopapular red raised areas on the right and left upper extremities  There are red raised maculopapular areas on both lower extremities  A few similar areas on the abdomen   Psychiatric: She has a normal mood and affect. Her behavior is normal.  Nursing note and vitals reviewed.  ED Treatments / Results  DIAGNOSTIC STUDIES: Oxygen Saturation is 98% on RA, normal by my interpretation.    COORDINATION OF CARE: 12:28 PM Discussed treatment plan with pt at bedside which includes Triamcinolone, Decadron and Vistaril and pt agreed to plan.  Labs (all labs ordered are listed, but only abnormal results are displayed) Labs Reviewed - No data to display  EKG  EKG Interpretation None      Radiology No results found.  Procedures Procedures (including critical care time)  Medications Ordered in ED Medications - No data to display  Initial Impression / Assessment and Plan / ED Course  I have reviewed the triage vital signs and the nursing notes.  Pertinent labs & imaging results that were available during my care of the patient were reviewed by me and considered in my medical decision making (see chart for details).  Clinical Course    **I personally  performed the services described in this documentation, which was scribed in my presence. The recorded information has been reviewed and is accurate.*  Final Clinical Impressions(s) / ED Diagnoses  Vital signs are stable. No evidence of secondary infection, no red streaking noted, pt has hx of bed bugs. I suspect the discomfort is an exacerbation of the bed bugs. I discussed with the pt vacuuming the couch and the areas around the cough and bed. Discussed washing clothes and linen daily until situation has resolved. Discussed appropriate spray for bed bugs. Prescription given for Triamcinolone, Decadron, and Vistaril. Pt is to follow up with PCP if not improving.  Final diagnoses:  Bedbug bite, initial encounter   New Prescriptions New Prescriptions   No medications on file     Theresa Kocher, PA-C 09/25/16 [place],  MD 09/28/16 1229

## 2016-09-25 NOTE — ED Triage Notes (Signed)
Pt also c/o right shoulder pain.  Fell down 5 carpeted steps about 2 weeks ago. Rates pain 9/10.

## 2016-09-25 NOTE — ED Triage Notes (Signed)
Treated 2-3 weeks ago for scabies.  C/o rash to back, arms, legs and abdomen.  Pt says she had bed bugs but got new mattress.  Was treated with predinsone x2 in ED and at PCP.

## 2016-09-25 NOTE — Discharge Instructions (Signed)
Please vacuum your bed and couch daily. Please vacuum the carpeted areas around your bed or couch daily until the situation is resolved. Please wash her linens and your clothing daily until this condition has resolved. Please re-spray your bed area until symptoms have resolved. Apply triamcinolone to rash areas 2 times daily. Use Decadron 2 times daily, and Vistaril 3 times daily for itching. Vistaril may cause drowsiness, please do not drive, drink alcohol, operate machinery, or participate in activities requiring concentration when taking this medication.

## 2016-12-17 ENCOUNTER — Emergency Department (HOSPITAL_COMMUNITY)
Admission: EM | Admit: 2016-12-17 | Discharge: 2016-12-17 | Disposition: A | Discharge status: Home / Self Care | Payer: Medicaid Other | Attending: Emergency Medicine | Admitting: Emergency Medicine

## 2016-12-17 ENCOUNTER — Emergency Department (HOSPITAL_COMMUNITY): Payer: Medicaid Other

## 2016-12-17 ENCOUNTER — Encounter (HOSPITAL_COMMUNITY): Payer: Self-pay | Admitting: Emergency Medicine

## 2016-12-17 DIAGNOSIS — Y929 Unspecified place or not applicable: Secondary | ICD-10-CM | POA: Insufficient documentation

## 2016-12-17 DIAGNOSIS — Y939 Activity, unspecified: Secondary | ICD-10-CM | POA: Insufficient documentation

## 2016-12-17 DIAGNOSIS — W19XXXA Unspecified fall, initial encounter: Secondary | ICD-10-CM

## 2016-12-17 DIAGNOSIS — S99911A Unspecified injury of right ankle, initial encounter: Secondary | ICD-10-CM | POA: Diagnosis present

## 2016-12-17 DIAGNOSIS — S8012XA Contusion of left lower leg, initial encounter: Secondary | ICD-10-CM

## 2016-12-17 DIAGNOSIS — S93401A Sprain of unspecified ligament of right ankle, initial encounter: Secondary | ICD-10-CM | POA: Insufficient documentation

## 2016-12-17 DIAGNOSIS — W108XXA Fall (on) (from) other stairs and steps, initial encounter: Secondary | ICD-10-CM | POA: Diagnosis not present

## 2016-12-17 DIAGNOSIS — Y999 Unspecified external cause status: Secondary | ICD-10-CM | POA: Diagnosis not present

## 2016-12-17 DIAGNOSIS — IMO0001 Reserved for inherently not codable concepts without codable children: Secondary | ICD-10-CM

## 2016-12-17 DIAGNOSIS — Z79899 Other long term (current) drug therapy: Secondary | ICD-10-CM | POA: Insufficient documentation

## 2016-12-17 DIAGNOSIS — I10 Essential (primary) hypertension: Secondary | ICD-10-CM | POA: Diagnosis not present

## 2016-12-17 DIAGNOSIS — S8002XA Contusion of left knee, initial encounter: Secondary | ICD-10-CM | POA: Diagnosis not present

## 2016-12-17 MED ORDER — TRAMADOL HCL 50 MG PO TABS: 50.0000 mg | ORAL_TABLET | Freq: Four times a day (QID) | ORAL | 0 refills | Status: DC | PRN

## 2016-12-17 MED ORDER — OXYCODONE-ACETAMINOPHEN 5-325 MG PO TABS
1.0000 | ORAL_TABLET | Freq: Once | ORAL | Status: AC
  Administered 2016-12-17: 1 via ORAL
  Filled 2016-12-17: qty 1

## 2016-12-17 NOTE — ED Triage Notes (Signed)
Pt reports falling down 1 step onto hardwood floor on Thursday. Pt reports LT knee/leg pain and RT ankle pain. Pt states pain has worsened. Bruising noted to LT leg. Pt ambulatory. Pt not on blood thinners.

## 2016-12-17 NOTE — ED Provider Notes (Signed)
[facility] DEPT Provider Note   CSN: [id number] Arrival date & time: 12/17/16  1558     History   Chief Complaint Chief Complaint  Patient presents with  . Fall    HPI [name] is a 46 y.o. female who presents to the ED with right ankle pain and left knee and lower leg pain. Patient reports that she missed a step and fell down one step onto hardwood 2 days ago. She has been taking tylenol without relief. She denies any other injuries. She reports that the pain is worse at night when she goes to bed after trying to walk during the day.   HPI  Past Medical History:  Diagnosis Date  . Anxiety   . Arthritis   . Depression   . Hypertension   . Seizures ([place])    had 1 seizure 9-10 yrs ago; unknown etiology and no meds. No more seizures    There are no active problems to display for this patient.   Past Surgical History:  Procedure Laterality Date  . CHOLECYSTECTOMY    . EYE SURGERY Right    detatched retina  . INCISIONAL HERNIA REPAIR N/A 09/08/2014   Procedure: [name] HERNIORRHAPHY WITH MESH;  Surgeon: Jamesetta So, MD;  Location: [facility];  Service: General;  Laterality: N/A;  . INSERTION OF MESH N/A 09/08/2014   Procedure: INSERTION OF MESH;  Surgeon: Jamesetta So, MD;  Location: [facility];  Service: General;  Laterality: N/A;  . KNEE SURGERY      OB History    No data available       Home Medications    Prior to Admission medications   Medication Sig Start Date End Date Taking? Authorizing Provider  dexamethasone (DECADRON) 4 MG tablet Take 1 tablet (4 mg total) by mouth 2 (two) times daily with a meal. 09/25/16   [name], PA-C  etonogestrel (IMPLANON) 68 MG IMPL implant Inject 1 each into the skin once.    Historical Provider, MD  hydrOXYzine (VISTARIL) 25 MG capsule Take 1 capsule (25 mg total) by mouth 3 (three) times daily as needed. 09/25/16   [name], PA-C  losartan (COZAAR) 50 MG tablet Take 50 mg by mouth daily.    Historical  Provider, MD  meclizine (ANTIVERT) 25 MG tablet Take 1 tablet (25 mg total) by mouth 3 (three) times daily as needed for dizziness. 08/11/15   [name], DO  ondansetron (ZOFRAN) 4 MG tablet Take 1 tablet (4 mg total) by mouth every 8 (eight) hours as needed for nausea or vomiting. 08/11/15   [name], DO  PARoxetine (PAXIL) 40 MG tablet Take 60 mg by mouth daily.    Historical Provider, MD  predniSONE (DELTASONE) 50 MG tablet One tablet PO daily for 5 days 07/07/16   Ripley Fraise, MD  risperiDONE (RISPERDAL) 1 MG tablet Take 1 mg by mouth daily.    Historical Provider, MD  traMADol (ULTRAM) 50 MG tablet Take 1 tablet (50 mg total) by mouth every 6 (six) hours as needed. 12/17/16   [place], NP  traZODone (DESYREL) 100 MG tablet Take 300 mg by mouth at bedtime.    Historical Provider, MD  triamcinolone cream (KENALOG) 0.1 % Apply 1 application topically 2 (two) times daily. 09/25/16   [name], PA-C    Family History No family history on file.  Social History Social History  Substance Use Topics  . Smoking status: Never Smoker  . Smokeless tobacco: Never Used  .  Alcohol use No     Allergies   Naproxen   Review of Systems Review of Systems  Gastrointestinal: Negative for nausea and vomiting.  Musculoskeletal: Positive for arthralgias. Negative for neck pain.       Right ankle, left knee  Skin: Positive for color change.       Bruising to left knee and lower leg  Psychiatric/Behavioral: Sleep disturbance: due to pain.     Physical Exam Updated Vital Signs BP 112/75 (BP Location: Right Arm)   Pulse 81   Temp 98 F (36.7 C) (Oral)   Resp 16   Ht 5\' 6"  (1.676 m)   Wt 117.9 kg   SpO2 100%   BMI 41.97 kg/m   Physical Exam  Constitutional: She appears well-developed and well-nourished. No distress.  HENT:  Head: Normocephalic and atraumatic.  Eyes: EOM are normal.  Neck: Normal range of motion. Neck supple.  Cardiovascular: Normal rate.     Pulmonary/Chest: Effort normal.  Musculoskeletal:       Left knee: She exhibits swelling and ecchymosis. She exhibits no laceration, no erythema and normal alignment. Decreased range of motion: due to pain. Tenderness found.       Right ankle: She exhibits swelling. She exhibits normal range of motion, no deformity, no laceration and normal pulse. Tenderness. Lateral malleolus tenderness found. Achilles tendon normal.  Pedal pulses 2+, adequate circulation, good touch sensation.  Tender with passive range of motion of the left knee and with palpation of the patella. Ecchymosis that started at the knee and extends midway down the left lower leg.   Neurological: She is alert.  Skin: Skin is warm and dry.  Psychiatric: She has a normal mood and affect. Her behavior is normal.  Nursing note and vitals reviewed.    ED Treatments / Results  Labs Radiology Dg Ankle Complete Right  Result Date: 12/17/2016 CLINICAL DATA:  Status post fall with right ankle pain. EXAM: RIGHT ANKLE - COMPLETE 3+ VIEW COMPARISON:  None. FINDINGS: There is no evidence of fracture, dislocation, or joint effusion. There is no evidence of arthropathy or other focal bone abnormality. Soft tissue swelling about the lateral malleolus noted. IMPRESSION: No acute fracture or dislocation identified about the right ankle. Lateral right ankle soft tissue swelling. Electronically Signed   By: Fidela Salisbury M.D.   On: 12/17/2016 17:23   Dg Knee Complete 4 Views Left  Result Date: 12/17/2016 CLINICAL DATA:  Fall Thursday, missed step. EXAM: LEFT KNEE - COMPLETE 4+ VIEW COMPARISON:  None. FINDINGS: No evidence of fracture, dislocation, or joint effusion. No evidence of arthropathy or other focal bone abnormality. Soft tissues are unremarkable. IMPRESSION: No fracture or dislocation. Electronically Signed   By: Suzy Bouchard M.D.   On: 12/17/2016 17:22    Procedures Procedures (including critical care time)  Medications  Ordered in ED Medications  oxyCODONE-acetaminophen (PERCOCET/ROXICET) 5-325 MG per tablet 1 tablet (1 tablet Oral Given 12/17/16 1731)     Initial Impression / Assessment and Plan / ED Course  I have reviewed the triage vital signs and the nursing notes.  Pertinent imaging results that were available during my care of the patient were reviewed by me and considered in my medical decision making (see chart for details).   Final Clinical Impressions(s) / ED Diagnoses  46 y.o. female with right ankle and left knee pain s/p fall 2 days ago stable for d/c without focal neuro deficits. ASO to right ankle and knee sleeve to left knee, ice elevation  and pain management. Patient to f/u with her PCP or return here as needed for worsening symptoms.  Final diagnoses:  Fall, initial encounter  Contusion of left knee and lower leg, initial encounter  First degree ankle sprain, right, initial encounter    New Prescriptions New Prescriptions   TRAMADOL (ULTRAM) 50 MG TABLET    Take 1 tablet (50 mg total) by mouth every 6 (six) hours as needed.     [place], NP 12/17/16 1754    Julianne Rice, MD 12/21/16 [phone]

## 2016-12-17 NOTE — Discharge Instructions (Signed)
Do not take the narcotic if driving as it will make you sleepy. Wear the ankle brace and knee brace for comfort and support. Elevate the areas as often as possible and apply ice as instructed in the discharge instructions. Follow up with DR. Knowlton. Return here as needed.

## 2017-04-10 ENCOUNTER — Emergency Department (HOSPITAL_COMMUNITY)
Admission: EM | Admit: 2017-04-10 | Discharge: 2017-04-10 | Disposition: A | Discharge status: Home / Self Care | Payer: Self-pay | Attending: Emergency Medicine | Admitting: Emergency Medicine

## 2017-04-10 ENCOUNTER — Encounter (HOSPITAL_COMMUNITY): Payer: Self-pay | Admitting: Emergency Medicine

## 2017-04-10 ENCOUNTER — Emergency Department (HOSPITAL_COMMUNITY): Payer: Self-pay

## 2017-04-10 DIAGNOSIS — M25511 Pain in right shoulder: Secondary | ICD-10-CM | POA: Insufficient documentation

## 2017-04-10 DIAGNOSIS — I10 Essential (primary) hypertension: Secondary | ICD-10-CM | POA: Insufficient documentation

## 2017-04-10 DIAGNOSIS — R21 Rash and other nonspecific skin eruption: Secondary | ICD-10-CM | POA: Insufficient documentation

## 2017-04-10 DIAGNOSIS — Z79899 Other long term (current) drug therapy: Secondary | ICD-10-CM | POA: Insufficient documentation

## 2017-04-10 DIAGNOSIS — T7840XA Allergy, unspecified, initial encounter: Secondary | ICD-10-CM | POA: Insufficient documentation

## 2017-04-10 MED ORDER — PREDNISONE 50 MG PO TABS
60.0000 mg | ORAL_TABLET | Freq: Once | ORAL | Status: AC
  Administered 2017-04-10: 60 mg via ORAL
  Filled 2017-04-10: qty 1

## 2017-04-10 MED ORDER — DIPHENHYDRAMINE HCL 25 MG PO CAPS
50.0000 mg | ORAL_CAPSULE | Freq: Once | ORAL | Status: AC
  Administered 2017-04-10: 50 mg via ORAL
  Filled 2017-04-10: qty 2

## 2017-04-10 MED ORDER — DIPHENHYDRAMINE HCL 25 MG PO TABS: 25.0000 mg | ORAL_TABLET | ORAL | 0 refills | Status: DC | PRN

## 2017-04-10 MED ORDER — PREDNISONE 10 MG PO TABS: ORAL_TABLET | ORAL | 0 refills | Status: DC

## 2017-04-10 NOTE — Discharge Instructions (Signed)
Start the prednisone prescription tomorrow.  Follow-up with Dr. Karie Kirks

## 2017-04-10 NOTE — ED Triage Notes (Signed)
Pt reports right shoulder pain from a fall 2 weeks ago as well as a generalized rash for a week.

## 2017-04-13 NOTE — ED Provider Notes (Signed)
[facility] DEPT Provider Note   CSN: [id number] Arrival date & time: 04/10/17  1113     History   Chief Complaint Chief Complaint  Patient presents with  . Shoulder Injury  . Rash    HPI [name] is a 46 y.o. female.  HPI   [name] is a 46 y.o. female who presents to the Emergency Department complaining of right shoulder pain for 2 weeks.  States that she had a mechanical fall landing on her right arm.  Describes pain with movement of the right shoulder.  Pain improves with the arm at rest.  Denies swelling, neck pain, head injury or LOC.  She also complains of a generalized itching rash for one week.  Describes the rash as itchy, originating on her trunk that has also spread to her arms and upper legs.  She denies fever, swelling, shortness of breath, swelling or tongue or lips, pain, tick bites and myalgias. Nothing makes the rash better or worse.    Past Medical History:  Diagnosis Date  . Anxiety   . Arthritis   . Depression   . Hypertension   . Seizures ([place])    had 1 seizure 9-10 yrs ago; unknown etiology and no meds. No more seizures    There are no active problems to display for this patient.   Past Surgical History:  Procedure Laterality Date  . CHOLECYSTECTOMY    . EYE SURGERY Right    detatched retina  . INCISIONAL HERNIA REPAIR N/A 09/08/2014   Procedure: [name] HERNIORRHAPHY WITH MESH;  Surgeon: Jamesetta So, MD;  Location: [facility];  Service: General;  Laterality: N/A;  . INSERTION OF MESH N/A 09/08/2014   Procedure: INSERTION OF MESH;  Surgeon: Jamesetta So, MD;  Location: [facility];  Service: General;  Laterality: N/A;  . KNEE SURGERY      OB History    No data available       Home Medications    Prior to Admission medications   Medication Sig Start Date End Date Taking? Authorizing Provider  atorvastatin (LIPITOR) 20 MG tablet Take 1 Tablet by mouth once daily for cholesterol 04/03/17  Yes [provider]    clonazePAM (KLONOPIN) 1 MG tablet TAKE ONE TABLET BY MOUTH UP TO THREE TIMES DAILY AS NEEDED FOR ANXIETY 03/13/17  Yes [provider]  etonogestrel (IMPLANON) 68 MG IMPL implant Inject 1 each into the skin once.   Yes [provider]  hydrOXYzine (VISTARIL) 25 MG capsule Take 1 capsule (25 mg total) by mouth 3 (three) times daily as needed. 09/25/16  Yes [name], PA-C  losartan (COZAAR) 50 MG tablet Take 50 mg by mouth daily.   Yes [provider]  PARoxetine (PAXIL) 40 MG tablet Take 60 mg by mouth daily.   Yes [provider]  risperiDONE (RISPERDAL) 1 MG tablet Take 1 mg by mouth daily.   Yes [provider]  traZODone (DESYREL) 100 MG tablet Take 300 mg by mouth at bedtime.   Yes [provider]  diphenhydrAMINE (BENADRYL) 25 MG tablet Take 1 tablet (25 mg total) by mouth every 4 (four) hours as needed. For itching 04/10/17   Sanad Fearnow, PA-C  predniSONE (DELTASONE) 10 MG tablet Take 6 tablets day one, 5 tablets day two, 4 tablets day three, 3 tablets day four, 2 tablets day five, then 1 tablet day six 04/10/17   Takako Minckler, PA-C  traMADol (ULTRAM) 50 MG tablet Take 1 tablet (50  mg total) by mouth every 6 (six) hours as needed. Patient not taking: Reported on 04/10/2017 12/17/16   [name], NP    Family History History reviewed. No pertinent family history.  Social History Social History  Substance Use Topics  . Smoking status: Never Smoker  . Smokeless tobacco: Never Used  . Alcohol use No     Allergies   Naproxen and Ultram [tramadol hcl]   Review of Systems Review of Systems  Constitutional: Negative for activity change, appetite change, chills and fever.  HENT: Negative for facial swelling, sore throat and trouble swallowing.   Respiratory: Negative for chest tightness, shortness of breath and wheezing.   Cardiovascular: Negative for chest pain.  Gastrointestinal: Negative for nausea and vomiting.   Musculoskeletal: Positive for arthralgias (right shoulder pain). Negative for back pain, myalgias, neck pain and neck stiffness.  Skin: Positive for rash. Negative for wound.  Neurological: Negative for dizziness, syncope, weakness, numbness and headaches.  Psychiatric/Behavioral: Negative for confusion.  All other systems reviewed and are negative.    Physical Exam Updated Vital Signs BP 102/72 (BP Location: Left Arm)   Pulse 97   Temp (!) 97.5 F (36.4 C) (Oral)   Resp 18   Ht 5\' 6"  (1.676 m)   Wt 127 kg (280 lb)   SpO2 100%   BMI 45.19 kg/m   Physical Exam  Constitutional: She is oriented to person, place, and time. She appears well-developed and well-nourished. No distress.  HENT:  Head: Normocephalic and atraumatic.  Mouth/Throat: Uvula is midline, oropharynx is clear and moist and mucous membranes are normal. No uvula swelling.  Neck: Normal range of motion. Neck supple.  Cardiovascular: Normal rate, regular rhythm, normal heart sounds and intact distal pulses.   No murmur heard. Pulmonary/Chest: Effort normal and breath sounds normal. No respiratory distress.  Musculoskeletal: She exhibits tenderness. She exhibits no edema.       Right shoulder: She exhibits tenderness and pain. She exhibits normal range of motion, no swelling, no crepitus and normal strength.  ttp on ROM of anterior right shoulder.  No bony deformity, edema or crepitus. No spinal tenderness  Lymphadenopathy:    She has no cervical adenopathy.  Neurological: She is alert and oriented to person, place, and time. She has normal strength. No sensory deficit. She exhibits normal muscle tone. Coordination normal.  Skin: Skin is warm. Capillary refill takes less than 2 seconds. Rash noted. There is erythema.  Erythematous, pruritic, maculopapular rash to most of the body including trunk and bilateral upper extremities and bilateral upper legs.  Hands, face and feet are spared.   Psychiatric: She has a normal  mood and affect.  Nursing note and vitals reviewed.    ED Treatments / Results  Labs (all labs ordered are listed, but only abnormal results are displayed) Labs Reviewed - No data to display  EKG  EKG Interpretation None       Radiology Dg Shoulder Right  Result Date: 04/10/2017 CLINICAL DATA:  Right shoulder pain.  Fall 3-4 weeks ago EXAM: RIGHT SHOULDER - 2+ VIEW COMPARISON:  None. FINDINGS: No acute bony abnormality. Specifically, no fracture, subluxation, or dislocation. Soft tissues are intact. IMPRESSION: No acute bony abnormality. Electronically Signed   By: Rolm Baptise M.D.   On: 04/10/2017 11:56     Procedures Procedures (including critical care time)  Medications Ordered in ED Medications  predniSONE (DELTASONE) tablet 60 mg (60 mg Oral Given 04/10/17 1144)  diphenhydrAMINE (BENADRYL) capsule 50 mg (50  mg Oral Given 04/10/17 1144)     Initial Impression / Assessment and Plan / ED Course  I have reviewed the triage vital signs and the nursing notes.  Pertinent labs & imaging results that were available during my care of the patient were reviewed by me and considered in my medical decision making (see chart for details).     Pt well appearing.  Vitals stable.  Non-toxic.  Pain on ROM of shoulder, XR neg for fx.  Likely sprain.  Rash appears c/w allergic rxn.  No concerning sx's for anaphylactic rxn. Appears stable for d/c, return precautions discussed.  Final Clinical Impressions(s) / ED Diagnoses   Final diagnoses:  Acute pain of right shoulder  Allergic reaction, initial encounter    New Prescriptions Discharge Medication List as of 04/10/2017 12:10 PM    START taking these medications   Details  diphenhydrAMINE (BENADRYL) 25 MG tablet Take 1 tablet (25 mg total) by mouth every 4 (four) hours as needed. For itching, Starting Mon 04/10/2017, Print    predniSONE (DELTASONE) 10 MG tablet Take 6 tablets day one, 5 tablets day two, 4 tablets day three, 3 tablets  day four, 2 tablets day five, then 1 tablet day six, Print         Kem Parkinson, PA-C 04/13/17 1158    [name], DO 04/14/17 1548

## 2017-05-13 ENCOUNTER — Emergency Department (HOSPITAL_COMMUNITY)
Admission: EM | Admit: 2017-05-13 | Discharge: 2017-05-13 | Disposition: A | Discharge status: Home / Self Care | Payer: Self-pay | Attending: Emergency Medicine | Admitting: Emergency Medicine

## 2017-05-13 ENCOUNTER — Encounter (HOSPITAL_COMMUNITY): Payer: Self-pay | Admitting: Emergency Medicine

## 2017-05-13 DIAGNOSIS — R21 Rash and other nonspecific skin eruption: Secondary | ICD-10-CM | POA: Insufficient documentation

## 2017-05-13 DIAGNOSIS — I10 Essential (primary) hypertension: Secondary | ICD-10-CM | POA: Insufficient documentation

## 2017-05-13 DIAGNOSIS — Z79899 Other long term (current) drug therapy: Secondary | ICD-10-CM | POA: Insufficient documentation

## 2017-05-13 MED ORDER — LORATADINE 10 MG PO CAPS: 1.0000 | ORAL_CAPSULE | Freq: Every day | ORAL | 0 refills | Status: DC

## 2017-05-13 NOTE — ED Notes (Signed)
Dr Y in to assess 

## 2017-05-13 NOTE — Discharge Instructions (Signed)
Please try loratidine which is an antihistamine in addition to your other medications. No signs of infection today. Please follow up with your regular doctor as an outpatient.

## 2017-05-13 NOTE — ED Provider Notes (Signed)
[facility] DEPT Provider Note   CSN: [id number] Arrival date & time: 05/13/17  1303     History   Chief Complaint Chief Complaint  Patient presents with  . Rash    HPI [name] is a 46 y.o. female.  Patient is a 46 yo F with long standing rash here with itching. Patient states that is a chronic problem for her going back almost a year. She has a rash that is all over her body and has tried OTC benadryl and hydroxyzine, topic kenalog and hydrocortisone cream without much relief. She states that at one point she was told might be bed bugs but her significant other who sleep I nthe same bed has never had any bites and she has tried permethrin cream without relief. She states her PCP thought it might be stress related and started her on an antianxiety medication without much relief. No fever/chills.      Past Medical History:  Diagnosis Date  . Anxiety   . Arthritis   . Depression   . Hypertension   . Seizures ([place])    had 1 seizure 9-10 yrs ago; unknown etiology and no meds. No more seizures    There are no active problems to display for this patient.   Past Surgical History:  Procedure Laterality Date  . CHOLECYSTECTOMY    . EYE SURGERY Right    detatched retina  . INCISIONAL HERNIA REPAIR N/A 09/08/2014   Procedure: [name] HERNIORRHAPHY WITH MESH;  Surgeon: Jamesetta So, MD;  Location: [facility];  Service: General;  Laterality: N/A;  . INSERTION OF MESH N/A 09/08/2014   Procedure: INSERTION OF MESH;  Surgeon: Jamesetta So, MD;  Location: [facility];  Service: General;  Laterality: N/A;  . KNEE SURGERY      OB History    No data available       Home Medications    Prior to Admission medications   Medication Sig Start Date End Date Taking? Authorizing Provider  atorvastatin (LIPITOR) 20 MG tablet Take 1 Tablet by mouth once daily for cholesterol 04/03/17   [provider]  clonazePAM (KLONOPIN) 1 MG tablet TAKE ONE TABLET BY MOUTH UP TO THREE  TIMES DAILY AS NEEDED FOR ANXIETY 03/13/17   [provider]  diphenhydrAMINE (BENADRYL) 25 MG tablet Take 1 tablet (25 mg total) by mouth every 4 (four) hours as needed. For itching 04/10/17   Triplett, Tammy, PA-C  etonogestrel (IMPLANON) 68 MG IMPL implant Inject 1 each into the skin once.    [provider]  hydrOXYzine (VISTARIL) 25 MG capsule Take 1 capsule (25 mg total) by mouth 3 (three) times daily as needed. 09/25/16   [name], PA-C  losartan (COZAAR) 50 MG tablet Take 50 mg by mouth daily.    [provider]  PARoxetine (PAXIL) 40 MG tablet Take 60 mg by mouth daily.    [provider]  predniSONE (DELTASONE) 10 MG tablet Take 6 tablets day one, 5 tablets day two, 4 tablets day three, 3 tablets day four, 2 tablets day five, then 1 tablet day six 04/10/17   Triplett, Tammy, PA-C  risperiDONE (RISPERDAL) 1 MG tablet Take 1 mg by mouth daily.    [provider]  traMADol (ULTRAM) 50 MG tablet Take 1 tablet (50 mg total) by mouth every 6 (six) hours as needed. Patient not taking: Reported on 04/10/2017 12/17/16   [name], NP  traZODone (DESYREL) 100 MG tablet Take 300 mg by  mouth at bedtime.    [provider]    Family History History reviewed. No pertinent family history.  Social History Social History  Substance Use Topics  . Smoking status: Never Smoker  . Smokeless tobacco: Never Used  . Alcohol use No     Allergies   Naproxen and Ultram [tramadol hcl]   Review of Systems Review of Systems  Constitutional: Negative for chills and fever.  Respiratory: Negative for chest tightness, shortness of breath and wheezing.   Cardiovascular: Negative for chest pain.  Gastrointestinal: Negative for abdominal pain, constipation, diarrhea, nausea and vomiting.  Musculoskeletal: Negative for arthralgias and myalgias.  Skin: Positive for rash.  Neurological: Negative for dizziness, light-headedness and headaches.      Physical Exam Updated Vital Signs BP 128/80 (BP Location: Right Arm)   Pulse (!) 108   Temp 98.3 F (36.8 C) (Oral)   Resp 20   Ht 5\' 6"  (1.676 m)   Wt 127 kg (280 lb)   SpO2 96%   BMI 45.19 kg/m   Physical Exam  Constitutional: She is oriented to person, place, and time. She appears well-developed and well-nourished. No distress.  HENT:  Head: Normocephalic and atraumatic.  Eyes: Conjunctivae are normal.  Neck: Normal range of motion. Neck supple.  Cardiovascular: Normal rate, regular rhythm, normal heart sounds and intact distal pulses.   No murmur heard. Pulmonary/Chest: Effort normal and breath sounds normal. No respiratory distress.  Abdominal: Soft. Bowel sounds are normal. She exhibits no distension. There is no tenderness. There is no rebound and no guarding.  Musculoskeletal: Normal range of motion.  Neurological: She is alert and oriented to person, place, and time.  Skin: Capillary refill takes less than 2 seconds. Rash (diffuse scattered urticarial rash all over patient's torso and extremities) noted.  Psychiatric: She has a normal mood and affect.     ED Treatments / Results  Labs (all labs ordered are listed, but only abnormal results are displayed) Labs Reviewed - No data to display  EKG  EKG Interpretation None       Radiology No results found.  Procedures Procedures (including critical care time)  Medications Ordered in ED Medications - No data to display   Initial Impression / Assessment and Plan / ED Course  I have reviewed the triage vital signs and the nursing notes.  Pertinent labs & imaging results that were available during my care of the patient were reviewed by me and considered in my medical decision making (see chart for details).   Patient has had a chronic rash that is diffuse, appears urticarial, itchy. Suspect is allergic in nature. No signs of infection. Patient has had no respiratory involvement and VSS. Rx for  loratidine given and discussed follow up with PCP.  Final Clinical Impressions(s) / ED Diagnoses   Final diagnoses:  Rash    New Prescriptions Discharge Medication List as of 05/13/2017  1:45 PM    START taking these medications   Details  Loratadine 10 MG CAPS Take 1 capsule (10 mg total) by mouth daily., Starting Sat 05/13/2017, Print         [name], DO 05/13/17 1600    Elnora Morrison, MD 05/14/17 [phone]

## 2017-05-13 NOTE — ED Triage Notes (Signed)
Pt reports recurrent rash, itching x1 year. Pt reports headache,sore throat for last several days. Pt reports seen pcp for same and reports was diagnosed with anxiety. Pt reports I take prednisone but it "always comes back." moderate anxiety noted in triage.

## 2017-07-20 ENCOUNTER — Encounter (HOSPITAL_COMMUNITY): Payer: Self-pay | Admitting: Emergency Medicine

## 2017-07-20 ENCOUNTER — Emergency Department (HOSPITAL_COMMUNITY)
Admission: EM | Admit: 2017-07-20 | Discharge: 2017-07-20 | Disposition: A | Discharge status: Home / Self Care | Payer: Self-pay | Attending: Emergency Medicine | Admitting: Emergency Medicine

## 2017-07-20 ENCOUNTER — Emergency Department (HOSPITAL_COMMUNITY): Payer: Self-pay

## 2017-07-20 DIAGNOSIS — K029 Dental caries, unspecified: Secondary | ICD-10-CM | POA: Insufficient documentation

## 2017-07-20 DIAGNOSIS — J40 Bronchitis, not specified as acute or chronic: Secondary | ICD-10-CM | POA: Insufficient documentation

## 2017-07-20 DIAGNOSIS — I10 Essential (primary) hypertension: Secondary | ICD-10-CM | POA: Insufficient documentation

## 2017-07-20 DIAGNOSIS — Z79899 Other long term (current) drug therapy: Secondary | ICD-10-CM | POA: Insufficient documentation

## 2017-07-20 MED ORDER — BENZONATATE 100 MG PO CAPS: 100.0000 mg | ORAL_CAPSULE | Freq: Three times a day (TID) | ORAL | 0 refills | Status: DC | PRN

## 2017-07-20 MED ORDER — ALBUTEROL SULFATE HFA 108 (90 BASE) MCG/ACT IN AERS
4.0000 | INHALATION_SPRAY | RESPIRATORY_TRACT | Status: AC
  Administered 2017-07-20: 4 via RESPIRATORY_TRACT
  Filled 2017-07-20: qty 6.7

## 2017-07-20 MED ORDER — PENICILLIN V POTASSIUM 250 MG PO TABS: 250.0000 mg | ORAL_TABLET | Freq: Four times a day (QID) | ORAL | 0 refills | Status: DC

## 2017-07-20 NOTE — ED Provider Notes (Signed)
[facility] EMERGENCY DEPARTMENT Provider Note   CSN: [id number] Arrival date & time: 07/20/17  2014     History   Chief Complaint Chief Complaint  Patient presents with  . Cough    HPI [name] is a 46 y.o. female.  HPI  Pt was seen at 2200.  Per pt, c/o gradual onset and persistence of constant cough for the past 1 week. Has been associated with intermittent "wheezing."  Denies sore throat, no  fevers, no rash, no CP/SOB, no N/V/D, no abd pain. Pt also c/o gradual onset and persistence of constant left lower tooth "pain" for the past several days to weeks. Pt states she is due to see her dentist in 1 week. Denies fevers, no intra-oral edema, no rash, no facial swelling, no dysphagia, no neck pain.    Past Medical History:  Diagnosis Date  . Anxiety   . Arthritis   . Depression   . Hypertension   . Seizures ([place])    had 1 seizure 9-10 yrs ago; unknown etiology and no meds. No more seizures    There are no active problems to display for this patient.   Past Surgical History:  Procedure Laterality Date  . CHOLECYSTECTOMY    . EYE SURGERY Right    detatched retina  . INCISIONAL HERNIA REPAIR N/A 09/08/2014   Procedure: [name] HERNIORRHAPHY WITH MESH;  Surgeon: Jamesetta So, MD;  Location: [facility];  Service: General;  Laterality: N/A;  . INSERTION OF MESH N/A 09/08/2014   Procedure: INSERTION OF MESH;  Surgeon: Jamesetta So, MD;  Location: [facility];  Service: General;  Laterality: N/A;  . KNEE SURGERY      OB History    No data available       Home Medications    Prior to Admission medications   Medication Sig Start Date End Date Taking? Authorizing Provider  atorvastatin (LIPITOR) 20 MG tablet Take 1 Tablet by mouth once daily for cholesterol 04/03/17   [provider]  clonazePAM (KLONOPIN) 1 MG tablet TAKE ONE TABLET BY MOUTH UP TO THREE TIMES DAILY AS NEEDED FOR ANXIETY 03/13/17   [provider]  diphenhydrAMINE (BENADRYL) 25 MG  tablet Take 1 tablet (25 mg total) by mouth every 4 (four) hours as needed. For itching 04/10/17   Triplett, Tammy, PA-C  etonogestrel (IMPLANON) 68 MG IMPL implant Inject 1 each into the skin once.    [provider]  hydrOXYzine (VISTARIL) 25 MG capsule Take 1 capsule (25 mg total) by mouth 3 (three) times daily as needed. 09/25/16   [name], PA-C  Loratadine 10 MG CAPS Take 1 capsule (10 mg total) by mouth daily. 05/13/17   [name], DO  losartan (COZAAR) 50 MG tablet Take 50 mg by mouth daily.    [provider]  PARoxetine (PAXIL) 40 MG tablet Take 60 mg by mouth daily.    [provider]  predniSONE (DELTASONE) 10 MG tablet Take 6 tablets day one, 5 tablets day two, 4 tablets day three, 3 tablets day four, 2 tablets day five, then 1 tablet day six 04/10/17   Triplett, Tammy, PA-C  risperiDONE (RISPERDAL) 1 MG tablet Take 1 mg by mouth daily.    [provider]  traMADol (ULTRAM) 50 MG tablet Take 1 tablet (50 mg total) by mouth every 6 (six) hours as needed. Patient not taking: Reported on 04/10/2017 12/17/16   [name], NP  traZODone (DESYREL) 100 MG tablet Take  300 mg by mouth at bedtime.    [provider]    Family History History reviewed. No pertinent family history.  Social History Social History  Substance Use Topics  . Smoking status: Never Smoker  . Smokeless tobacco: Never Used  . Alcohol use No     Allergies   Naproxen and Ultram [tramadol hcl]   Review of Systems Review of Systems ROS: Statement: All systems negative except as marked or noted in the HPI; Constitutional: Negative for fever and chills. ; ; Eyes: Negative for eye pain and discharge. ; ; ENMT: Positive for dental caries, dental hygiene poor and toothache. Negative for ear pain, bleeding gums, dental injury, facial deformity, facial swelling, hoarseness, nasal congestion, sinus pressure, sore throat, throat swelling and tongue swollen. ; Cardiovascular:  Negative for chest pain, palpitations, diaphoresis, dyspnea and peripheral edema. ; ; Respiratory: +cough, wheezing. Negative for stridor. ; ; Gastrointestinal: Negative for nausea, vomiting, diarrhea, abdominal pain, blood in stool, hematemesis, jaundice and rectal bleeding. . ; ; Genitourinary: Negative for dysuria, flank pain and hematuria. ; ; Musculoskeletal: Negative for back pain and neck pain. Negative for swelling and trauma.; ; Skin: Negative for pruritus, rash, abrasions, blisters, bruising and skin lesion.; ; Neuro: Negative for headache, lightheadedness and neck stiffness. Negative for weakness, altered level of consciousness, altered mental status, extremity weakness, paresthesias, involuntary movement, seizure and syncope.      Physical Exam Updated Vital Signs BP 115/86 (BP Location: Right Arm)   Pulse 61   Temp (!) 97.5 F (36.4 C) (Oral)   Resp 18   LMP 07/21/2007 (Approximate) Comment: states she has an implant  SpO2 97%   Physical Exam 2205: Physical examination: Vital signs and O2 SAT: Reviewed; Constitutional: Well developed, Well nourished, Well hydrated, In no acute distress; Head and Face: Normocephalic, Atraumatic; Eyes: EOMI, PERRL, No scleral icterus; ENMT: Mouth and pharynx normal, Poor dentition, Widespread dental decay, Left TM normal, Right TM normal, Mucous membranes moist, +lower left lateral incisor with extensive dental decay.  No gingival erythema, edema, fluctuance, or drainage.  No intra-oral edema. No submandibular or sublingual edema. No hoarse voice, no drooling, no stridor. No trismus. ;  Neck: Supple, Full range of motion, No lymphadenopathy; Cardiovascular: Regular rate and rhythm, No gallop; Respiratory: Breath sounds clear & equal bilaterally, No wheezes.  Speaking full sentences with ease, Normal respiratory effort/excursion; Chest: Nontender, Movement normal; Abdomen: Soft, Nontender, Nondistended, Normal bowel sounds;; Extremities: Pulses normal, No  tenderness, No edema, No calf edema or asymmetry.; Neuro: AA&Ox3, Major CN grossly intact.  Speech clear. No gross focal motor or sensory deficits in extremities.; Skin: Color normal, Warm, Dry.   ED Treatments / Results  Labs (all labs ordered are listed, but only abnormal results are displayed)   EKG  EKG Interpretation None       Radiology   Procedures Procedures (including critical care time)  Medications Ordered in ED Medications  albuterol (PROVENTIL HFA;VENTOLIN HFA) 108 (90 Base) MCG/ACT inhaler 4 puff (not administered)     Initial Impression / Assessment and Plan / ED Course  I have reviewed the triage vital signs and the nursing notes.  Pertinent labs & imaging results that were available during my care of the patient were reviewed by me and considered in my medical decision making (see chart for details).  MDM Reviewed: previous chart, nursing note and vitals Interpretation: x-ray    Dg Chest 2 View Result Date: 07/20/2017 CLINICAL DATA:  Cough, wheezing chest discomfort x1 week.  EXAM: CHEST  2 VIEW COMPARISON:  01/27/2015 FINDINGS: The heart size and mediastinal contours are within normal limits. Slightly low lung volumes with bibasilar subsegmental atelectasis. The visualized skeletal structures are unremarkable. IMPRESSION: Slightly low lung volumes with atelectasis at each base. Otherwise negative exam. Electronically Signed   By: Ashley Royalty M.D.   On: 07/20/2017 21:00    2215:  XR reassuring. Tx symptomatically for bronchitis and dental caries. Dx and testing d/w pt and family.  Questions answered.  Verb understanding, agreeable to d/c home with outpt f/u.     Final Clinical Impressions(s) / ED Diagnoses   Final diagnoses:  None    New Prescriptions New Prescriptions   No medications on file     [name], DO 07/24/17 [id number]

## 2017-07-20 NOTE — ED Triage Notes (Signed)
Pt c/o cough and wheezing started today. Denies pain. Nad. No resp distress or ss of sob noted.

## 2017-07-20 NOTE — Discharge Instructions (Signed)
Take the prescriptions as directed.  Use your albuterol inhaler (2 to 4 puffs) every 4 hours for the next 7 days, then as needed for cough, wheezing, or shortness of breath.  Call your regular medical doctor tomorrow morning to schedule a follow up appointment within the next 3 days. Call your dentist tomorrow morning to schedule a follow up appointment within the next week.  Return to the Emergency Department immediately sooner if worsening.

## 2017-10-03 HISTORY — PX: BREAST BIOPSY: SHX20

## 2018-02-01 ENCOUNTER — Other Ambulatory Visit (HOSPITAL_COMMUNITY): Payer: Self-pay | Admitting: *Deleted

## 2018-02-01 DIAGNOSIS — N6320 Unspecified lump in the left breast, unspecified quadrant: Secondary | ICD-10-CM

## 2018-02-13 ENCOUNTER — Ambulatory Visit (HOSPITAL_COMMUNITY)
Admission: RE | Admit: 2018-02-13 | Discharge: 2018-02-13 | Disposition: A | Discharge status: Home / Self Care | Payer: PRIVATE HEALTH INSURANCE | Source: Ambulatory Visit | Attending: *Deleted | Admitting: *Deleted

## 2018-02-13 ENCOUNTER — Other Ambulatory Visit (HOSPITAL_COMMUNITY): Payer: Self-pay | Admitting: *Deleted

## 2018-02-13 ENCOUNTER — Encounter (HOSPITAL_COMMUNITY): Payer: Self-pay

## 2018-02-13 DIAGNOSIS — N6320 Unspecified lump in the left breast, unspecified quadrant: Secondary | ICD-10-CM

## 2018-02-13 DIAGNOSIS — N6324 Unspecified lump in the left breast, lower inner quadrant: Secondary | ICD-10-CM | POA: Diagnosis present

## 2018-02-13 DIAGNOSIS — N6322 Unspecified lump in the left breast, upper inner quadrant: Secondary | ICD-10-CM | POA: Insufficient documentation

## 2018-02-13 DIAGNOSIS — R2232 Localized swelling, mass and lump, left upper limb: Secondary | ICD-10-CM

## 2018-02-20 ENCOUNTER — Encounter (HOSPITAL_COMMUNITY): Payer: Self-pay

## 2018-02-20 ENCOUNTER — Other Ambulatory Visit (HOSPITAL_COMMUNITY): Payer: Self-pay | Admitting: *Deleted

## 2018-02-20 ENCOUNTER — Ambulatory Visit (HOSPITAL_COMMUNITY)
Admission: RE | Admit: 2018-02-20 | Discharge: 2018-02-20 | Disposition: A | Discharge status: Home / Self Care | Payer: PRIVATE HEALTH INSURANCE | Source: Ambulatory Visit | Attending: *Deleted | Admitting: *Deleted

## 2018-02-20 DIAGNOSIS — D242 Benign neoplasm of left breast: Secondary | ICD-10-CM | POA: Insufficient documentation

## 2018-02-20 DIAGNOSIS — N6320 Unspecified lump in the left breast, unspecified quadrant: Secondary | ICD-10-CM

## 2018-02-20 DIAGNOSIS — R928 Other abnormal and inconclusive findings on diagnostic imaging of breast: Secondary | ICD-10-CM | POA: Diagnosis not present

## 2018-02-20 MED ORDER — LIDOCAINE HCL (PF) 1 % IJ SOLN
INTRAMUSCULAR | Status: AC
  Administered 2018-02-20: 3 mL
  Filled 2018-02-20: qty 5

## 2018-02-20 MED ORDER — LIDOCAINE-EPINEPHRINE (PF) 1 %-1:200000 IJ SOLN
INTRAMUSCULAR | Status: AC
  Administered 2018-02-20: 5 mL
  Filled 2018-02-20: qty 30

## 2018-02-25 ENCOUNTER — Other Ambulatory Visit: Payer: Self-pay

## 2018-02-25 ENCOUNTER — Encounter (HOSPITAL_COMMUNITY): Payer: Self-pay | Admitting: Emergency Medicine

## 2018-02-25 ENCOUNTER — Emergency Department (HOSPITAL_COMMUNITY)
Admission: EM | Admit: 2018-02-25 | Discharge: 2018-02-25 | Disposition: A | Discharge status: Home / Self Care | Payer: Medicaid Other | Attending: Emergency Medicine | Admitting: Emergency Medicine

## 2018-02-25 DIAGNOSIS — Y929 Unspecified place or not applicable: Secondary | ICD-10-CM | POA: Insufficient documentation

## 2018-02-25 DIAGNOSIS — Y998 Other external cause status: Secondary | ICD-10-CM | POA: Insufficient documentation

## 2018-02-25 DIAGNOSIS — K0889 Other specified disorders of teeth and supporting structures: Secondary | ICD-10-CM | POA: Insufficient documentation

## 2018-02-25 DIAGNOSIS — Y93G1 Activity, food preparation and clean up: Secondary | ICD-10-CM | POA: Insufficient documentation

## 2018-02-25 DIAGNOSIS — Z79899 Other long term (current) drug therapy: Secondary | ICD-10-CM | POA: Insufficient documentation

## 2018-02-25 DIAGNOSIS — K047 Periapical abscess without sinus: Secondary | ICD-10-CM | POA: Insufficient documentation

## 2018-02-25 DIAGNOSIS — S025XXB Fracture of tooth (traumatic), initial encounter for open fracture: Secondary | ICD-10-CM | POA: Insufficient documentation

## 2018-02-25 DIAGNOSIS — X58XXXA Exposure to other specified factors, initial encounter: Secondary | ICD-10-CM | POA: Insufficient documentation

## 2018-02-25 DIAGNOSIS — I10 Essential (primary) hypertension: Secondary | ICD-10-CM | POA: Insufficient documentation

## 2018-02-25 MED ORDER — HYDROCODONE-ACETAMINOPHEN 5-325 MG PO TABS
1.0000 | ORAL_TABLET | Freq: Once | ORAL | Status: AC
  Administered 2018-02-25: 1 via ORAL
  Filled 2018-02-25: qty 1

## 2018-02-25 MED ORDER — AMOXICILLIN 250 MG PO CAPS
500.0000 mg | ORAL_CAPSULE | Freq: Once | ORAL | Status: AC
  Administered 2018-02-25: 500 mg via ORAL
  Filled 2018-02-25: qty 2

## 2018-02-25 MED ORDER — HYDROCODONE-ACETAMINOPHEN 5-325 MG PO TABS: 1.0000 | ORAL_TABLET | ORAL | 0 refills | Status: DC | PRN

## 2018-02-25 MED ORDER — AMOXICILLIN 500 MG PO CAPS: 500.0000 mg | ORAL_CAPSULE | Freq: Three times a day (TID) | ORAL | 0 refills | Status: AC

## 2018-02-25 NOTE — Discharge Instructions (Addendum)
Complete your entire course of antibiotics as prescribed.  You  may use the hydrocodone for pain relief but do not drive within 4 hours of taking as this will make you drowsy.    You may use warm salt water swish and spit treatment or half peroxide and water swish and spit after meals to keep this area clean as discussed.  Call the dentist listed above for further management of your symptoms.

## 2018-02-25 NOTE — ED Notes (Signed)
Pt with blackened, decayed teeth reports tooth pain to her bottom front tooth  She reports no dentist  Environmental education officer information provided
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
## 2018-02-25 NOTE — ED Triage Notes (Signed)
Pt reports chronic dental pain due to broken teeth.

## 2018-02-25 NOTE — ED Notes (Addendum)
Awaiting DC instructions (Rxnot signed)

## 2018-08-20 ENCOUNTER — Other Ambulatory Visit (HOSPITAL_COMMUNITY): Payer: Self-pay | Admitting: *Deleted

## 2018-08-20 DIAGNOSIS — R229 Localized swelling, mass and lump, unspecified: Principal | ICD-10-CM

## 2018-08-20 DIAGNOSIS — IMO0002 Reserved for concepts with insufficient information to code with codable children: Secondary | ICD-10-CM

## 2018-09-04 ENCOUNTER — Ambulatory Visit (HOSPITAL_COMMUNITY): Admission: RE | Admit: 2018-09-04 | Payer: Self-pay | Source: Ambulatory Visit

## 2018-09-04 ENCOUNTER — Inpatient Hospital Stay (HOSPITAL_COMMUNITY): Admission: RE | Admit: 2018-09-04 | Payer: Self-pay | Source: Ambulatory Visit

## 2018-09-04 ENCOUNTER — Encounter (HOSPITAL_COMMUNITY): Payer: Self-pay

## 2018-10-02 ENCOUNTER — Emergency Department (HOSPITAL_COMMUNITY): Payer: Self-pay

## 2018-10-02 ENCOUNTER — Encounter (HOSPITAL_COMMUNITY): Payer: Self-pay

## 2018-10-02 ENCOUNTER — Emergency Department (HOSPITAL_COMMUNITY)
Admission: EM | Admit: 2018-10-02 | Discharge: 2018-10-02 | Disposition: A | Discharge status: Home / Self Care | Payer: Self-pay | Attending: Emergency Medicine | Admitting: Emergency Medicine

## 2018-10-02 ENCOUNTER — Other Ambulatory Visit: Payer: Self-pay

## 2018-10-02 DIAGNOSIS — I10 Essential (primary) hypertension: Secondary | ICD-10-CM | POA: Insufficient documentation

## 2018-10-02 DIAGNOSIS — M25512 Pain in left shoulder: Secondary | ICD-10-CM | POA: Insufficient documentation

## 2018-10-02 DIAGNOSIS — Z79899 Other long term (current) drug therapy: Secondary | ICD-10-CM | POA: Insufficient documentation

## 2018-10-02 MED ORDER — METHOCARBAMOL 500 MG PO TABS: 500.0000 mg | ORAL_TABLET | Freq: Every evening | ORAL | 0 refills | Status: DC | PRN

## 2018-10-02 MED ORDER — ACETAMINOPHEN 325 MG PO TABS
650.0000 mg | ORAL_TABLET | Freq: Once | ORAL | Status: AC
  Administered 2018-10-02: 650 mg via ORAL
  Filled 2018-10-02: qty 2

## 2018-10-02 MED ORDER — METHOCARBAMOL 500 MG PO TABS
500.0000 mg | ORAL_TABLET | Freq: Once | ORAL | Status: AC
Start: 2018-10-02 — End: 2018-10-02
  Administered 2018-10-02: 500 mg via ORAL
  Filled 2018-10-02: qty 1

## 2018-10-02 MED ORDER — LIDOCAINE 5 % EX PTCH: 1.0000 | MEDICATED_PATCH | CUTANEOUS | 0 refills | Status: DC

## 2018-10-02 NOTE — ED Provider Notes (Signed)
[facility] EMERGENCY DEPARTMENT Provider Note   CSN: [id number] Arrival date & time: 10/02/18  1137     History   Chief Complaint Chief Complaint  Patient presents with  . Cyst    HPI [name] is a 47 y.o. female with a history of arthritis and HTN who presents to the ED today for left shoulder pain. Patient reports she had a fall 1 month ago where she injured her right shoulder.  No head trauma loss consciousness with the fall.  She reports that she has been having pain on the anterior and posterior aspects of her left shoulder since that time.  She reports that 2 weeks ago she went and got a flu shot in her left shoulder as well.  She notes some stiffness and tightness of the shoulder since that time especially when reaching her hand above her head.  She was concerned because last week there is a cystlike structure beneath her skin that has now resolved.  She has been taking 800 mg ibuprofen for symptoms without any relief.  Patient denies any fevers at home.  No neck pain.  No numbness/tingling/weakness.  HPI  Past Medical History:  Diagnosis Date  . Anxiety   . Arthritis   . Depression   . Hypertension   . Seizures ([place])    had 1 seizure 9-10 yrs ago; unknown etiology and no meds. No more seizures    There are no active problems to display for this patient.   Past Surgical History:  Procedure Laterality Date  . CHOLECYSTECTOMY    . EYE SURGERY Right    detatched retina  . INCISIONAL HERNIA REPAIR N/A 09/08/2014   Procedure: [name] HERNIORRHAPHY WITH MESH;  Surgeon: Jamesetta So, MD;  Location: [facility];  Service: General;  Laterality: N/A;  . INSERTION OF MESH N/A 09/08/2014   Procedure: INSERTION OF MESH;  Surgeon: Jamesetta So, MD;  Location: [facility];  Service: General;  Laterality: N/A;  . KNEE SURGERY       OB History   No obstetric history on file.      Home Medications    Prior to Admission medications   Medication Sig Start Date End Date  Taking? Authorizing Provider  atorvastatin (LIPITOR) 20 MG tablet Take 1 Tablet by mouth once daily for cholesterol 04/03/17   [provider]  benzonatate (TESSALON) 100 MG capsule Take 1 capsule (100 mg total) by mouth 3 (three) times daily as needed for cough. 07/20/17   [name], DO  clonazePAM (KLONOPIN) 1 MG tablet TAKE ONE TABLET BY MOUTH UP TO THREE TIMES DAILY AS NEEDED FOR ANXIETY 03/13/17   [provider]  diphenhydrAMINE (BENADRYL) 25 MG tablet Take 1 tablet (25 mg total) by mouth every 4 (four) hours as needed. For itching 04/10/17   Triplett, Tammy, PA-C  etonogestrel (IMPLANON) 68 MG IMPL implant Inject 1 each into the skin once.    [provider]  HYDROcodone-acetaminophen (NORCO/VICODIN) 5-325 MG tablet Take 1 tablet by mouth every 4 (four) hours as needed. 02/25/18   [name], PA-C  hydrOXYzine (VISTARIL) 25 MG capsule Take 1 capsule (25 mg total) by mouth 3 (three) times daily as needed. 09/25/16   [name], PA-C  Loratadine 10 MG CAPS Take 1 capsule (10 mg total) by mouth daily. 05/13/17   [name], DO  losartan (COZAAR) 50 MG tablet Take 50 mg by mouth daily.    [provider]  PARoxetine (PAXIL) 40 MG  tablet Take 60 mg by mouth daily.    [provider]  penicillin v potassium (VEETID) 250 MG tablet Take 1 tablet (250 mg total) by mouth 4 (four) times daily. 07/20/17   [name], DO  predniSONE (DELTASONE) 10 MG tablet Take 6 tablets day one, 5 tablets day two, 4 tablets day three, 3 tablets day four, 2 tablets day five, then 1 tablet day six 04/10/17   Triplett, Tammy, PA-C  risperiDONE (RISPERDAL) 1 MG tablet Take 1 mg by mouth daily.    [provider]  traMADol (ULTRAM) 50 MG tablet Take 1 tablet (50 mg total) by mouth every 6 (six) hours as needed. Patient not taking: Reported on 04/10/2017 12/17/16   [name], NP  traZODone (DESYREL) 100 MG tablet Take 300 mg by mouth at bedtime.    [provider]    Family History No family history on file.  Social History Social History   Tobacco Use  . Smoking status: Never Smoker  . Smokeless tobacco: Never Used  Substance Use Topics  . Alcohol use: No  . Drug use: No     Allergies   Naproxen and Ultram [tramadol hcl]   Review of Systems Review of Systems  Constitutional: Negative for fever.  Eyes: Negative for visual disturbance.  Respiratory: Negative for chest tightness and shortness of breath.   Gastrointestinal: Negative for nausea and vomiting.  Musculoskeletal: Positive for arthralgias and myalgias.  Skin: Negative for wound.  Neurological: Negative for weakness, numbness and headaches.     Physical Exam Updated Vital Signs BP 108/70 (BP Location: Right Arm)   Pulse 90   Temp 97.7 F (36.5 C)   Resp 14   Ht 5\' 6"  (1.676 m)   Wt 129.7 kg   SpO2 98%   BMI 46.16 kg/m   Physical Exam Vitals signs and nursing note reviewed.  Constitutional:      Appearance: She is well-developed. She is not diaphoretic.  HENT:     Head: Normocephalic and atraumatic.     Right Ear: External ear normal.     Left Ear: External ear normal.     Nose: Nose normal.     Mouth/Throat:     Pharynx: Uvula midline.     Tonsils: No tonsillar exudate.  Eyes:     General: No scleral icterus.       Right eye: No discharge.        Left eye: No discharge.     Pupils: Pupils are equal, round, and reactive to light.  Neck:     Musculoskeletal: Neck supple. Normal range of motion. No neck rigidity or spinous process tenderness.     Trachea: Trachea normal.  Cardiovascular:     Rate and Rhythm: Normal rate and regular rhythm.     Pulses:          Radial pulses are 2+ on the right side and 2+ on the left side.       Dorsalis pedis pulses are 2+ on the right side and 2+ on the left side.       Posterior tibial pulses are 2+ on the right side and 2+ on the left side.     Heart sounds: No murmur.     Comments: No lower  extremity swelling or edema. Calves symmetric in size bilaterally. Pulmonary:     Effort: Pulmonary effort is normal.     Breath sounds: Normal breath sounds.  Chest:     Chest wall:  No tenderness.  Abdominal:     General: Bowel sounds are normal.     Palpations: Abdomen is soft.     Tenderness: There is no abdominal tenderness. There is no guarding or rebound.  Musculoskeletal:     Left shoulder: She exhibits tenderness and bony tenderness. She exhibits normal range of motion, no swelling, no effusion, no deformity and no laceration.     Left elbow: Normal.     Cervical back: Normal.       Arms:  Lymphadenopathy:     Cervical: No cervical adenopathy.  Skin:    General: Skin is warm and dry.     Findings: No rash.     Comments: Deep palpation to the left neck, shoulder and arm without any evidence of mass, induration, fluctuance.  There is no skin changes.  No erythema or heat.  Neurological:     Mental Status: She is alert.     Comments: Speech clear. Follows commands. No facial droop. PERRLA. EOMI. Normal peripheral fields.  Cranial Nerves:  II:  Pupils equal, round, reactive to light III,IV, VI: ptosis not present, extra-ocular motions intact bilaterally  V,VII: smile symmetric, facial light touch sensation equal VIII: hearing grossly normal bilaterally  IX,X: midline uvula rise  XI: bilateral shoulder shrug equal and strong XII: midline tongue extension Grossly moves all extremities 4 without ataxia. Coordination intact. Able and appropriate strength for age to upper and lower extremities bilaterally including grip strength & plantar flexion/dorsiflexion. Sensation to light touch intact bilaterally for upper and lower. Patellar deep tendon reflex 2+ and equal bilaterally. Normal finger to nose. No pronator drift. Normal gait into room.       ED Treatments / Results  Labs (all labs ordered are listed, but only abnormal results are displayed) Labs Reviewed - No data to  display  EKG None  Radiology Dg Shoulder Left  Result Date: 10/02/2018 CLINICAL DATA:  Left shoulder pain since a fall 4 weeks ago. EXAM: LEFT SHOULDER - 2+ VIEW COMPARISON:  None. FINDINGS: There is no evidence of fracture or dislocation. There is no evidence of arthropathy or other focal bone abnormality. Soft tissues are unremarkable. IMPRESSION: Negative. Electronically Signed   By: Lorriane Shire M.D.   On: 10/02/2018 14:15    Procedures Procedures (including critical care time)  Medications Ordered in ED Medications  methocarbamol (ROBAXIN) tablet 500 mg (500 mg Oral Given 10/02/18 1414)  acetaminophen (TYLENOL) tablet 650 mg (650 mg Oral Given 10/02/18 1414)     Initial Impression / Assessment and Plan / ED Course  I have reviewed the triage vital signs and the nursing notes.  Pertinent labs & imaging results that were available during my care of the patient were reviewed by me and considered in my medical decision making (see chart for details).     47 year old female presenting for left shoulder pain after a mechanical fall 1 month ago.  Patient also with concerns that she had a mass 1 week ago after a flu shot 2 weeks ago.  This is now resolved.  Patient reports pain occurs with range of motion of the shoulder as well as palpation of the shoulder.  She denies any neurologic symptoms.  Neuro exam is without any focal deficits.  She is neurovascular intact.  Patient with noted tenderness above the scapular spine as well as along the lateral aspect of the shoulder.  Patient has intact range of motion but notes pain with maximal abduction and flexion above her head.  Compartments are soft.  I do not suspect septic joint as the patient is afebrile with normal range of motion and no skin erythema or heat.  X-rays were obtained.  There is no evidence of fracture or dislocation.  During patient stay family member called and stated that the patient has been "misusing narcotics and has  been seeking opiates from different facilities".  Please see nurse note who received the call.  Patient requesting medications for pain.  She has a allergy to naproxen however been stating she has been taking ibuprofen at home.  Will give muscle relaxers as well as lidocaine patch.  Discussed with patient not to drive while taking muscle relaxers.  I have provided the patient with a sling.  I discussed with patient to take her arm out of the sling and perform shoulder range of motion exercise in order to prevent frozen shoulder.  She is to follow-up with primary care versus PCP.  Return precautions were discussed.  Final Clinical Impressions(s) / ED Diagnoses   Final diagnoses:  Acute pain of left shoulder    ED Discharge Orders         Ordered    methocarbamol (ROBAXIN) 500 MG tablet  At bedtime PRN     10/02/18 1546    lidocaine (LIDODERM) 5 %  Every 24 hours     10/02/18 1546           Lorelle Gibbs 10/02/18 1623    Fredia Sorrow, MD 10/03/18 [phone]

## 2018-10-02 NOTE — ED Notes (Signed)
Pt's family called stating the patient has been misusing narcotics and has been seeking opiates from different facilities.

## 2018-10-02 NOTE — Discharge Instructions (Addendum)
Please read and follow all provided instructions.  You have been seen today for left shoulder pain and concerns of a cyst after flu injection  Tests performed today include: An x-ray of the affected area - does NOT show any broken bones or dislocations.  Vital signs. See below for your results today.   Home care instructions: -- *PRICE in the first 24-48 hours after injury: Protect (with brace, splint, sling), if given by your provider - -please take your shoulder out of shoulder sling at least once per day and perform shoulder range of motion exercises in order to prevent frozen shoulder. Rest Ice- Do not apply ice pack directly to your skin, place towel or similar between your skin and ice/ice pack. Apply ice for 20 min, then remove for 40 min while awake Compression- Wear brace, elastic bandage, splint as directed by your provider Elevate affected extremity above the level of your heart when not walking around for the first 24-48 hours   Muscle relaxants:  These medications can help with muscle tightness.  Most of these medications can cause drowsiness, and it is not safe to drive or use dangerous machinery while taking them. They are primarily helpful when taken at night before sleep.  Follow-up instructions: Please follow-up with your primary care provider or the provided orthopedic physician (bone specialist) if you continue to have significant pain in 1 week. In this case you may have a more severe injury that requires further care.   Return instructions:  Please return if your fingers or hand are numb or tingling, appear gray or blue, or you have severe pain (also elevate the leg and loosen splint or wrap if you were given one) You develop any chest pain or shortness of breath You develop fever You develop any joint swelling, redness or difficulty with range of motion of the joint You have any new visual changes You have difficulty with speech or changes in your speech or  hearing You feel like one side your face is drooping You have new weakness of your arms or legs You have difficulty walking You develop new neck pain You develop dizziness Please return to the Emergency Department if you experience worsening symptoms.  Please return if you have any other emergent concerns. Additional Information:  Your vital signs today were: BP 122/70    Pulse (!) 101    Temp 97.7 F (36.5 C)    Resp 20    Ht 5\' 6"  (1.676 m)    Wt 129.7 kg    SpO2 98%    BMI 46.16 kg/m  If your blood pressure (BP) was elevated above 135/85 this visit, please have this repeated by your doctor within one month. ---------------

## 2018-10-02 NOTE — ED Triage Notes (Addendum)
Pt fell 4 weeks ago. 2 weeks after this she received a flu shot in her left arm. Can't lift arm over her head as easily. Has been taking ibuprofen for the pain, 800mg  per day. Pt is also complaining of increased lethargy.

## 2018-10-09 ENCOUNTER — Ambulatory Visit (HOSPITAL_COMMUNITY): Admission: RE | Admit: 2018-10-09 | Payer: PRIVATE HEALTH INSURANCE | Source: Ambulatory Visit

## 2018-10-09 ENCOUNTER — Ambulatory Visit (HOSPITAL_COMMUNITY)
Admission: RE | Admit: 2018-10-09 | Discharge: 2018-10-09 | Disposition: A | Discharge status: Home / Self Care | Payer: PRIVATE HEALTH INSURANCE | Source: Ambulatory Visit | Attending: *Deleted | Admitting: *Deleted

## 2018-10-09 DIAGNOSIS — R229 Localized swelling, mass and lump, unspecified: Secondary | ICD-10-CM | POA: Diagnosis present

## 2018-10-09 DIAGNOSIS — IMO0002 Reserved for concepts with insufficient information to code with codable children: Secondary | ICD-10-CM

## 2019-03-18 ENCOUNTER — Emergency Department (HOSPITAL_COMMUNITY)
Admission: EM | Admit: 2019-03-18 | Discharge: 2019-03-18 | Disposition: A | Discharge status: Home / Self Care | Payer: Medicaid Other | Attending: Emergency Medicine | Admitting: Emergency Medicine

## 2019-03-18 ENCOUNTER — Encounter (HOSPITAL_COMMUNITY): Payer: Self-pay

## 2019-03-18 ENCOUNTER — Other Ambulatory Visit: Payer: Self-pay

## 2019-03-18 DIAGNOSIS — M79606 Pain in leg, unspecified: Secondary | ICD-10-CM | POA: Insufficient documentation

## 2019-03-18 DIAGNOSIS — R51 Headache: Secondary | ICD-10-CM | POA: Insufficient documentation

## 2019-03-18 DIAGNOSIS — Z0471 Encounter for examination and observation following alleged adult physical abuse: Secondary | ICD-10-CM | POA: Insufficient documentation

## 2019-03-18 DIAGNOSIS — Z5321 Procedure and treatment not carried out due to patient leaving prior to being seen by health care provider: Secondary | ICD-10-CM | POA: Insufficient documentation

## 2019-03-18 HISTORY — DX: Gastric ulcer, unspecified as acute or chronic, without hemorrhage or perforation: K25.9

## 2019-03-18 NOTE — ED Notes (Signed)
Pt seen walking out of building

## 2019-03-18 NOTE — ED Notes (Signed)
Pt called again with no answer, pt not in [place].
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
## 2019-03-31 ENCOUNTER — Other Ambulatory Visit: Payer: Self-pay

## 2019-03-31 ENCOUNTER — Encounter (HOSPITAL_COMMUNITY): Payer: Self-pay

## 2019-03-31 ENCOUNTER — Emergency Department (HOSPITAL_COMMUNITY)
Admission: EM | Admit: 2019-03-31 | Discharge: 2019-04-01 | Disposition: A | Discharge status: Home / Self Care | Payer: Medicaid Other | Attending: Emergency Medicine | Admitting: Emergency Medicine

## 2019-03-31 DIAGNOSIS — F314 Bipolar disorder, current episode depressed, severe, without psychotic features: Secondary | ICD-10-CM | POA: Insufficient documentation

## 2019-03-31 DIAGNOSIS — Z79899 Other long term (current) drug therapy: Secondary | ICD-10-CM | POA: Insufficient documentation

## 2019-03-31 DIAGNOSIS — R45851 Suicidal ideations: Secondary | ICD-10-CM

## 2019-03-31 DIAGNOSIS — I10 Essential (primary) hypertension: Secondary | ICD-10-CM | POA: Insufficient documentation

## 2019-03-31 LAB — COMPREHENSIVE METABOLIC PANEL
ALT: 20 U/L (ref 0–44)
AST: 19 U/L (ref 15–41)
Albumin: 3.6 g/dL (ref 3.5–5.0)
Alkaline Phosphatase: 59 U/L (ref 38–126)
Anion gap: 9 (ref 5–15)
BUN: 7 mg/dL (ref 6–20)
CO2: 25 mmol/L (ref 22–32)
Calcium: 8.8 mg/dL — ABNORMAL LOW (ref 8.9–10.3)
Chloride: 104 mmol/L (ref 98–111)
Creatinine, Ser: 0.76 mg/dL (ref 0.44–1.00)
GFR calc Af Amer: >60 mL/min (ref >60)
GFR calc non Af Amer: >60 mL/min (ref >60)
Glucose, Bld: 101 mg/dL — ABNORMAL HIGH (ref 70–99)
Potassium: 3.3 mmol/L — ABNORMAL LOW (ref 3.5–5.1)
Sodium: 138 mmol/L (ref 135–145)
Total Bilirubin: 0.8 mg/dL (ref 0.3–1.2)
Total Protein: 7 g/dL (ref 6.5–8.1)

## 2019-03-31 LAB — ETHANOL: Alcohol, Ethyl (B): <10 mg/dL (ref <10)

## 2019-03-31 LAB — CBC
HCT: 42.9 % (ref 36.0–46.0)
Hemoglobin: 13.8 g/dL (ref 12.0–15.0)
MCH: 29.4 pg (ref 26.0–34.0)
MCHC: 32.2 g/dL (ref 30.0–36.0)
MCV: 91.5 fL (ref 80.0–100.0)
Platelets: 321 10*3/uL (ref 150–400)
RBC: 4.69 MIL/uL (ref 3.87–5.11)
RDW: 13.7 % (ref 11.5–15.5)
WBC: 5.8 10*3/uL (ref 4.0–10.5)
nRBC: 0 % (ref 0.0–0.2)

## 2019-03-31 LAB — ACETAMINOPHEN LEVEL: Acetaminophen (Tylenol), Serum: <10 ug/mL — ABNORMAL LOW (ref 10–30)

## 2019-03-31 LAB — RAPID URINE DRUG SCREEN, HOSP PERFORMED
Amphetamines: NOT DETECTED
Barbiturates: NOT DETECTED
Benzodiazepines: POSITIVE — AB
Cocaine: NOT DETECTED
Opiates: NOT DETECTED
Tetrahydrocannabinol: NOT DETECTED

## 2019-03-31 LAB — PREGNANCY, URINE: Preg Test, Ur: NEGATIVE

## 2019-03-31 LAB — SALICYLATE LEVEL: Salicylate Lvl: <7 mg/dL (ref 2.8–30.0)

## 2019-03-31 MED ORDER — POTASSIUM CHLORIDE CRYS ER 20 MEQ PO TBCR
40.0000 meq | EXTENDED_RELEASE_TABLET | Freq: Once | ORAL | Status: AC
  Administered 2019-03-31: 40 meq via ORAL
  Filled 2019-03-31: qty 2

## 2019-03-31 MED ORDER — RISPERIDONE 1 MG PO TABS: 1.0000 mg | ORAL_TABLET | Freq: Every day | ORAL | 0 refills | Status: AC

## 2019-03-31 NOTE — ED Notes (Signed)
Patient has no ride home   She is awaiting social work to assist her in the am should her spouse not decide to let her come home

## 2019-03-31 NOTE — ED Notes (Signed)
Pt changed into purle scrubs   wanded x 2  Security called to secure 1- 100 bill                                           6- five dollar bills                                          16 one dollar bills                                             1= ten dollar bill                                              3- twenty dollar bills Black foldover billfold  To security  Two clothing bags to closet
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
## 2019-03-31 NOTE — ED Triage Notes (Addendum)
Pt brought to ED by [facility] with IVC papers which states pt suffers from PTSD, bipolar, manic depression and anxiety disorder. Currently she is not eating and will not get out of the bed except to go to the bathroom. Pt stated yesterday to her stepsister Santiago Glad to just leave her alone and she wanted to die and she was done. Per IVC papers, pt has had suicide attempts in the past but pt denies. Pt denies SI or HI. Pt recently incarcerated and released last Friday.

## 2019-03-31 NOTE — ED Notes (Signed)
Pt wanded by security at this time  ?

## 2019-03-31 NOTE — ED Notes (Signed)
TTS ready and machine moved to room
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
## 2019-03-31 NOTE — Discharge Instructions (Signed)
Please follow-up with your doctor or [facility] for further evaluation and treatment, as well as refills, of your medications.  Please return to the emergency department if you develop any new or worsening symptoms including suicidal ideations.

## 2019-03-31 NOTE — ED Notes (Signed)
Lab at bedside

## 2019-04-01 NOTE — ED Notes (Signed)
Values given back to pt by security at this time.

## 2019-06-06 ENCOUNTER — Encounter (HOSPITAL_COMMUNITY): Payer: Self-pay | Admitting: Emergency Medicine

## 2019-06-06 ENCOUNTER — Emergency Department (HOSPITAL_COMMUNITY): Payer: Self-pay

## 2019-06-06 ENCOUNTER — Other Ambulatory Visit: Payer: Self-pay

## 2019-06-06 ENCOUNTER — Emergency Department (HOSPITAL_COMMUNITY)
Admission: EM | Admit: 2019-06-06 | Discharge: 2019-06-06 | Disposition: A | Discharge status: Home / Self Care | Payer: Self-pay | Attending: Emergency Medicine | Admitting: Emergency Medicine

## 2019-06-06 DIAGNOSIS — G44309 Post-traumatic headache, unspecified, not intractable: Secondary | ICD-10-CM | POA: Insufficient documentation

## 2019-06-06 DIAGNOSIS — I10 Essential (primary) hypertension: Secondary | ICD-10-CM | POA: Insufficient documentation

## 2019-06-06 DIAGNOSIS — F0781 Postconcussional syndrome: Secondary | ICD-10-CM | POA: Insufficient documentation

## 2019-06-06 DIAGNOSIS — Z79899 Other long term (current) drug therapy: Secondary | ICD-10-CM | POA: Insufficient documentation

## 2019-06-06 DIAGNOSIS — E876 Hypokalemia: Secondary | ICD-10-CM | POA: Insufficient documentation

## 2019-06-06 LAB — CBC WITH DIFFERENTIAL/PLATELET
Abs Immature Granulocytes: 0.01 10*3/uL (ref 0.00–0.07)
Basophils Absolute: 0 10*3/uL (ref 0.0–0.1)
Basophils Relative: 0 %
Eosinophils Absolute: 0.1 10*3/uL (ref 0.0–0.5)
Eosinophils Relative: 1 %
HCT: 39.7 % (ref 36.0–46.0)
Hemoglobin: 12.5 g/dL (ref 12.0–15.0)
Immature Granulocytes: 0 %
Lymphocytes Relative: 33 %
Lymphs Abs: 2.1 10*3/uL (ref 0.7–4.0)
MCH: 29.6 pg (ref 26.0–34.0)
MCHC: 31.5 g/dL (ref 30.0–36.0)
MCV: 93.9 fL (ref 80.0–100.0)
Monocytes Absolute: 0.4 10*3/uL (ref 0.1–1.0)
Monocytes Relative: 6 %
Neutro Abs: 3.8 10*3/uL (ref 1.7–7.7)
Neutrophils Relative %: 60 %
Platelets: 269 10*3/uL (ref 150–400)
RBC: 4.23 MIL/uL (ref 3.87–5.11)
RDW: 13.4 % (ref 11.5–15.5)
WBC: 6.4 10*3/uL (ref 4.0–10.5)
nRBC: 0 % (ref 0.0–0.2)

## 2019-06-06 LAB — URINALYSIS, ROUTINE W REFLEX MICROSCOPIC
Bacteria, UA: NONE SEEN
Bilirubin Urine: NEGATIVE
Glucose, UA: NEGATIVE mg/dL
Ketones, ur: NEGATIVE mg/dL
Leukocytes,Ua: NEGATIVE
Nitrite: NEGATIVE
Protein, ur: NEGATIVE mg/dL
Specific Gravity, Urine: 1.008 (ref 1.005–1.030)
pH: 6 (ref 5.0–8.0)

## 2019-06-06 LAB — BASIC METABOLIC PANEL
Anion gap: 7 (ref 5–15)
BUN: 8 mg/dL (ref 6–20)
CO2: 26 mmol/L (ref 22–32)
Calcium: 8.7 mg/dL — ABNORMAL LOW (ref 8.9–10.3)
Chloride: 106 mmol/L (ref 98–111)
Creatinine, Ser: 0.85 mg/dL (ref 0.44–1.00)
GFR calc Af Amer: >60 mL/min (ref >60)
GFR calc non Af Amer: >60 mL/min (ref >60)
Glucose, Bld: 76 mg/dL (ref 70–99)
Potassium: 3.1 mmol/L — ABNORMAL LOW (ref 3.5–5.1)
Sodium: 139 mmol/L (ref 135–145)

## 2019-06-06 LAB — POC URINE PREG, ED: Preg Test, Ur: NEGATIVE

## 2019-06-06 MED ORDER — SODIUM CHLORIDE 0.9 % IV BOLUS
1000.0000 mL | Freq: Once | INTRAVENOUS | Status: AC
  Administered 2019-06-06: 1000 mL via INTRAVENOUS

## 2019-06-06 MED ORDER — DEXAMETHASONE SODIUM PHOSPHATE 10 MG/ML IJ SOLN
10.0000 mg | Freq: Once | INTRAMUSCULAR | Status: AC
  Administered 2019-06-06: 10 mg via INTRAVENOUS
  Filled 2019-06-06: qty 1

## 2019-06-06 MED ORDER — METOCLOPRAMIDE HCL 5 MG/ML IJ SOLN
10.0000 mg | Freq: Once | INTRAMUSCULAR | Status: AC
  Administered 2019-06-06: 10 mg via INTRAVENOUS
  Filled 2019-06-06: qty 2

## 2019-06-06 MED ORDER — HYDROCODONE-ACETAMINOPHEN 5-325 MG PO TABS
1.0000 | ORAL_TABLET | Freq: Once | ORAL | Status: AC
  Administered 2019-06-06: 1 via ORAL
  Filled 2019-06-06: qty 1

## 2019-06-06 MED ORDER — DIPHENHYDRAMINE HCL 50 MG/ML IJ SOLN
12.5000 mg | Freq: Once | INTRAMUSCULAR | Status: AC
  Administered 2019-06-06: 12.5 mg via INTRAVENOUS
  Filled 2019-06-06: qty 1

## 2019-06-06 MED ORDER — BUTALBITAL-APAP-CAFFEINE 50-325-40 MG PO TABS: 1.0000 | ORAL_TABLET | Freq: Four times a day (QID) | ORAL | 0 refills | Status: DC | PRN

## 2019-06-06 MED ORDER — POTASSIUM CHLORIDE CRYS ER 20 MEQ PO TBCR
40.0000 meq | EXTENDED_RELEASE_TABLET | Freq: Once | ORAL | Status: AC
  Administered 2019-06-06: 40 meq via ORAL
  Filled 2019-06-06: qty 2
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
## 2019-06-06 NOTE — ED Provider Notes (Signed)
[facility] EMERGENCY DEPARTMENT Provider Note   CSN: [id number] Arrival date & time: 06/06/19  1319     History   Chief Complaint Chief Complaint  Patient presents with   Headache    HPI [name] is a 48 y.o. female with a history of anxiety, depression, HTN and history gastric ulcers presenting for evaluation of persistent headache and neck pain after falling down a flight of steps 5 weeks ago.  She tripped near the top of the flight and hit her head on the wall, also injured her right knee during the injury and was seen at an outside hospital [facility]) but pt states they only examined her knee (which is improved).  She has persistent posterior headache in association with episodes of hallucinations describing she has seen imaginary butterflies in her home, also seeing "lightning" across the ceiling and thought her sofa was talking to her, but the hallucination sx resolved about a week ago.  Husband at bedside confirms this sx has resolved.   Most concerned about persistent headache which has not responded to tylenol.  She denies focal weakness, denies dizziness, vision changes. She does have nausea without emesis and notices reduced ability to concentrate. She is scheduled to see her primary MD tomorrow.      The history is provided by the patient and the spouse.  Headache Associated symptoms: nausea   Associated symptoms: no abdominal pain, no congestion, no dizziness, no fever, no neck pain, no numbness, no sore throat, no vomiting and no weakness     Past Medical History:  Diagnosis Date   Anxiety    Arthritis    Depression    Hypertension    Multiple gastric ulcers    Seizures ([place])    had 1 seizure 9-10 yrs ago; unknown etiology and no meds. No more seizures    There are no active problems to display for this patient.   Past Surgical History:  Procedure Laterality Date   CHOLECYSTECTOMY     EYE SURGERY Right    detatched retina   INCISIONAL  HERNIA REPAIR N/A 09/08/2014   Procedure: [name] HERNIORRHAPHY WITH MESH;  Surgeon: Jamesetta So, MD;  Location: [facility];  Service: General;  Laterality: N/A;   INSERTION OF MESH N/A 09/08/2014   Procedure: INSERTION OF MESH;  Surgeon: Jamesetta So, MD;  Location: [facility];  Service: General;  Laterality: N/A;   KNEE SURGERY       OB History   No obstetric history on file.      Home Medications    Prior to Admission medications   Medication Sig Start Date End Date Taking? Authorizing Provider  atorvastatin (LIPITOR) 20 MG tablet Take 1 Tablet by mouth once daily for cholesterol 04/03/17   [provider]  benzonatate (TESSALON) 100 MG capsule Take 1 capsule (100 mg total) by mouth 3 (three) times daily as needed for cough. 07/20/17   [name], DO  butalbital-acetaminophen-caffeine (FIORICET) [id number] MG tablet Take 1 tablet by mouth every 6 (six) hours as needed for headache. 06/06/19   Espn Zeman, Almyra Free, PA-C  clonazePAM (KLONOPIN) 1 MG tablet TAKE ONE TABLET BY MOUTH UP TO THREE TIMES DAILY AS NEEDED FOR ANXIETY 03/13/17   [provider]  diphenhydrAMINE (BENADRYL) 25 MG tablet Take 1 tablet (25 mg total) by mouth every 4 (four) hours as needed. For itching 04/10/17   Triplett, Tammy, PA-C  etonogestrel (IMPLANON) 68 MG IMPL implant Inject 1 each into the skin once.  [provider]  HYDROcodone-acetaminophen (NORCO/VICODIN) 5-325 MG tablet Take 1 tablet by mouth every 4 (four) hours as needed. 02/25/18   [name], PA-C  hydrOXYzine (VISTARIL) 25 MG capsule Take 1 capsule (25 mg total) by mouth 3 (three) times daily as needed. 09/25/16   [name], PA-C  lidocaine (LIDODERM) 5 % Place 1 patch onto the skin daily. Remove & Discard patch within 12 hours or as directed by MD 10/02/18   Maczis, Barth Kirks, PA-C  Loratadine 10 MG CAPS Take 1 capsule (10 mg total) by mouth daily. 05/13/17   [name], DO  losartan (COZAAR) 50 MG tablet Take 50 mg by  mouth daily.    [provider]  methocarbamol (ROBAXIN) 500 MG tablet Take 1 tablet (500 mg total) by mouth at bedtime as needed for muscle spasms. 10/02/18   Maczis, Barth Kirks, PA-C  PARoxetine (PAXIL) 40 MG tablet Take 60 mg by mouth daily.    [provider]  penicillin v potassium (VEETID) 250 MG tablet Take 1 tablet (250 mg total) by mouth 4 (four) times daily. 07/20/17   [name], DO  predniSONE (DELTASONE) 10 MG tablet Take 6 tablets day one, 5 tablets day two, 4 tablets day three, 3 tablets day four, 2 tablets day five, then 1 tablet day six 04/10/17   Triplett, Tammy, PA-C  risperiDONE (RISPERDAL) 1 MG tablet Take 1 tablet (1 mg total) by mouth daily. 03/31/19   Law, Bea Graff, PA-C  traMADol (ULTRAM) 50 MG tablet Take 1 tablet (50 mg total) by mouth every 6 (six) hours as needed. Patient not taking: Reported on 04/10/2017 12/17/16   [name], NP  traZODone (DESYREL) 100 MG tablet Take 300 mg by mouth at bedtime.    [provider]    Family History History reviewed. No pertinent family history.  Social History Social History   Tobacco Use   Smoking status: Never Smoker   Smokeless tobacco: Never Used  Substance Use Topics   Alcohol use: No   Drug use: No     Allergies   Naproxen and Ultram [tramadol hcl]   Review of Systems Review of Systems  Constitutional: Negative for chills and fever.  HENT: Negative for congestion, rhinorrhea and sore throat.   Eyes: Negative.   Respiratory: Negative for chest tightness and shortness of breath.   Cardiovascular: Negative for chest pain.  Gastrointestinal: Positive for nausea. Negative for abdominal pain and vomiting.  Genitourinary: Negative.   Musculoskeletal: Negative for arthralgias, joint swelling and neck pain.  Skin: Negative.  Negative for rash and wound.  Neurological: Positive for headaches. Negative for dizziness, weakness, light-headedness and numbness.    Psychiatric/Behavioral: Positive for hallucinations. The patient is not nervous/anxious.   All other systems reviewed and are negative.    Physical Exam Updated Vital Signs BP 104/79 (BP Location: Right Arm)    Pulse 83    Temp 98.5 F (36.9 C) (Oral)    Resp 18    Ht 5\' 6"  (1.676 m)    Wt 130.2 kg    SpO2 100%    BMI 46.32 kg/m   Physical Exam Vitals signs and nursing note reviewed.  Constitutional:      Appearance: She is well-developed. She is obese.     Comments: Uncomfortable appearing  HENT:     Head: Normocephalic and atraumatic.  Eyes:     Extraocular Movements: Extraocular movements intact.     Conjunctiva/sclera: Conjunctivae normal.     Pupils:  Pupils are equal, round, and reactive to light.  Neck:     Musculoskeletal: Normal range of motion and neck supple. Pain with movement and spinous process tenderness present. No neck rigidity or crepitus.  Cardiovascular:     Rate and Rhythm: Normal rate and regular rhythm.     Heart sounds: Normal heart sounds.  Pulmonary:     Effort: Pulmonary effort is normal.     Breath sounds: Normal breath sounds. No wheezing.  Abdominal:     General: Bowel sounds are normal.     Palpations: Abdomen is soft.     Tenderness: There is no abdominal tenderness.  Musculoskeletal: Normal range of motion.  Lymphadenopathy:     Cervical: No cervical adenopathy.  Skin:    General: Skin is warm and dry.     Findings: No rash.  Neurological:     Mental Status: She is alert and oriented to person, place, and time.     GCS: GCS eye subscore is 4. GCS verbal subscore is 5. GCS motor subscore is 6.     Sensory: No sensory deficit.     Motor: Motor function is intact.     Gait: Gait normal.     Comments: Normal heel-shin, normal rapid alternating movements. Cranial nerves III-XII intact.  No pronator drift. Equal grip strength.  Psychiatric:        Attention and Perception: She does not perceive auditory or visual hallucinations.        Mood  and Affect: Mood normal.        Speech: Speech normal.        Behavior: Behavior normal. Behavior is cooperative.        Thought Content: Thought content normal.      ED Treatments / Results  Labs (all labs ordered are listed, but only abnormal results are displayed) Labs Reviewed  BASIC METABOLIC PANEL - Abnormal; Notable for the following components:      Result Value   Potassium 3.1 (*)    Calcium 8.7 (*)    All other components within normal limits  URINALYSIS, ROUTINE W REFLEX MICROSCOPIC - Abnormal; Notable for the following components:   Hgb urine dipstick SMALL (*)    All other components within normal limits  CBC WITH DIFFERENTIAL/PLATELET  POC URINE PREG, ED    EKG None  Radiology Ct Head Wo Contrast  Result Date: 06/06/2019 CLINICAL DATA:  Headache, hallucinations.  Fall EXAM: CT HEAD WITHOUT CONTRAST CT CERVICAL SPINE WITHOUT CONTRAST TECHNIQUE: Multidetector CT imaging of the head and cervical spine was performed following the standard protocol without intravenous contrast. Multiplanar CT image reconstructions of the cervical spine were also generated. COMPARISON:  08/11/2015, 01/17/2010 FINDINGS: CT HEAD FINDINGS Brain: No evidence of acute infarction, hemorrhage, hydrocephalus, extra-axial collection or mass lesion/mass effect. Vascular: No hyperdense vessel or unexpected calcification. Skull: Normal. Negative for fracture or focal lesion. Sinuses/Orbits: No acute finding. Other: None. CT CERVICAL SPINE FINDINGS Alignment: Straightening of the cervical lordosis, which may be positional. Skull base and vertebrae: No acute fracture. No primary bone lesion or focal pathologic process. Soft tissues and spinal canal: No prevertebral fluid or swelling. No visible canal hematoma. Disc levels: Mild facet arthrosis of the mid to lower cervical spine. No high-grade canal stenosis. Upper chest: Lung apices clear. Enlarged, multinodular thyroid gland. Other: None. IMPRESSION: 1. No  acute intracranial findings. 2. No acute fracture or subluxation of the cervical spine. 3. Enlarged, multinodular thyroid gland. Further evaluation with thyroid ultrasound is recommended on  a nonemergent basis, if not previously performed. Electronically Signed   By: Davina Poke M.D.   On: 06/06/2019 16:33   Ct Cervical Spine Wo Contrast  Result Date: 06/06/2019 CLINICAL DATA:  Headache, hallucinations.  Fall EXAM: CT HEAD WITHOUT CONTRAST CT CERVICAL SPINE WITHOUT CONTRAST TECHNIQUE: Multidetector CT imaging of the head and cervical spine was performed following the standard protocol without intravenous contrast. Multiplanar CT image reconstructions of the cervical spine were also generated. COMPARISON:  08/11/2015, 01/17/2010 FINDINGS: CT HEAD FINDINGS Brain: No evidence of acute infarction, hemorrhage, hydrocephalus, extra-axial collection or mass lesion/mass effect. Vascular: No hyperdense vessel or unexpected calcification. Skull: Normal. Negative for fracture or focal lesion. Sinuses/Orbits: No acute finding. Other: None. CT CERVICAL SPINE FINDINGS Alignment: Straightening of the cervical lordosis, which may be positional. Skull base and vertebrae: No acute fracture. No primary bone lesion or focal pathologic process. Soft tissues and spinal canal: No prevertebral fluid or swelling. No visible canal hematoma. Disc levels: Mild facet arthrosis of the mid to lower cervical spine. No high-grade canal stenosis. Upper chest: Lung apices clear. Enlarged, multinodular thyroid gland. Other: None. IMPRESSION: 1. No acute intracranial findings. 2. No acute fracture or subluxation of the cervical spine. 3. Enlarged, multinodular thyroid gland. Further evaluation with thyroid ultrasound is recommended on a nonemergent basis, if not previously performed. Electronically Signed   By: Davina Poke M.D.   On: 06/06/2019 16:33    Procedures Procedures (including critical care time)  Medications Ordered in  ED Medications  sodium chloride 0.9 % bolus 1,000 mL (1,000 mLs Intravenous New Bag/Given 06/06/19 1515)  metoCLOPramide (REGLAN) injection 10 mg (10 mg Intravenous Given 06/06/19 1517)  dexamethasone (DECADRON) injection 10 mg (10 mg Intravenous Given 06/06/19 1518)  diphenhydrAMINE (BENADRYL) injection 12.5 mg (12.5 mg Intravenous Given 06/06/19 1517)  potassium chloride SA (K-DUR) CR tablet 40 mEq (40 mEq Oral Given 06/06/19 1624)  HYDROcodone-acetaminophen (NORCO/VICODIN) 5-325 MG per tablet 1 tablet (1 tablet Oral Given 06/06/19 1652)     Initial Impression / Assessment and Plan / ED Course  I have reviewed the triage vital signs and the nursing notes.  Pertinent labs & imaging results that were available during my care of the patient were reviewed by me and considered in my medical decision making (see chart for details).        Labs and imaging reviewed, no acute findings on imaging,  Pt has hypokalemia, replaced, outlined dietary choices to replace.  Pt reports has appt with pcp tomorrow!  Advised f/u as planned.   Final Clinical Impressions(s) / ED Diagnoses   Final diagnoses:  Post concussion syndrome  Hypokalemia    ED Discharge Orders         Ordered    butalbital-acetaminophen-caffeine (FIORICET) 50-325-40 MG tablet  Every 6 hours PRN     06/06/19 1658           [name], Hershal Coria 06/06/19 1704    Noemi Chapel, MD 06/11/19 1022

## 2019-06-06 NOTE — ED Triage Notes (Signed)
Fell 5 weeks ago and was seen at [facility] and only treated for knee.  Pt reports hitting her head and now she is having hallucinations and the couch is talking to here.

## 2019-08-12 ENCOUNTER — Encounter: Payer: Self-pay | Admitting: General Practice

## 2019-08-12 ENCOUNTER — Ambulatory Visit: Payer: Medicaid Other | Admitting: Family Medicine

## 2019-09-12 IMAGING — MG DIGITAL DIAGNOSTIC BILATERAL MAMMOGRAM WITH TOMO AND CAD
8 of 11 series · 8 of 27 positions shown · non-contrast
Comparison: Previous exam(s).

CLINICAL DATA: Follow-up for probably benign findings in the left
breast from 9109. The patient also reports a palpable abnormality in
the inner left breast which she has been feeling for the past
several months.

EXAM:
DIGITAL DIAGNOSTIC BILATERAL MAMMOGRAM WITH CAD AND TOMO
LEFT BREAST ULTRASOUND

[L MLO (1 of 2)]
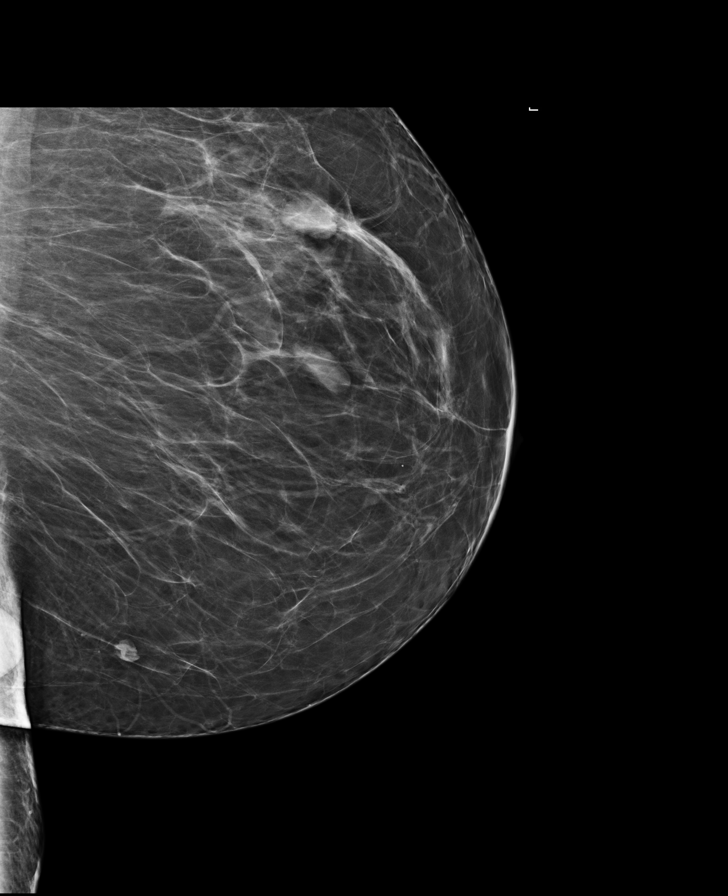

[R MLO (1 of 2)]
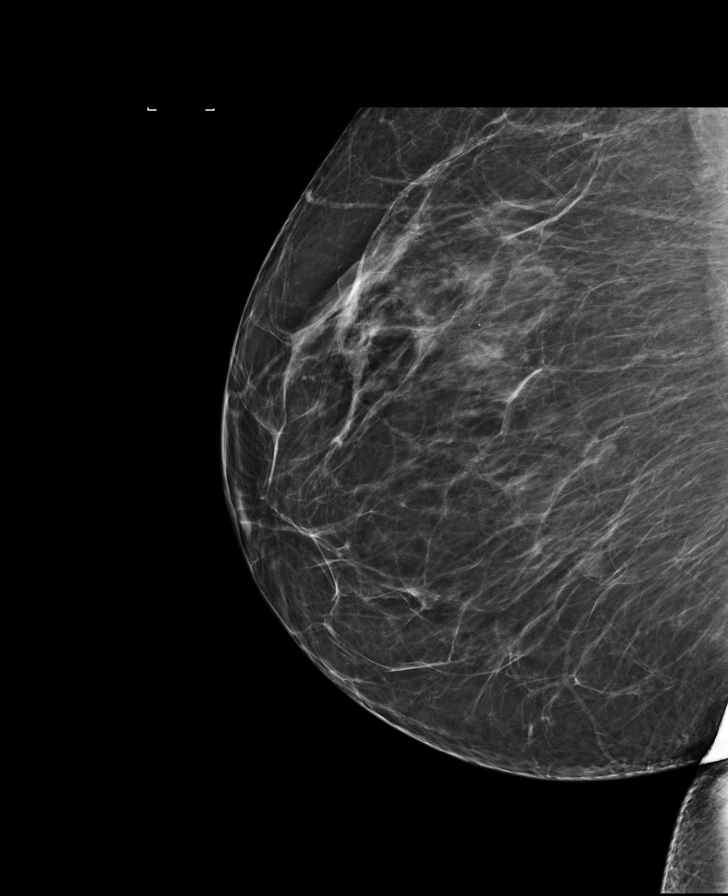

[R CC (1 of 2)]
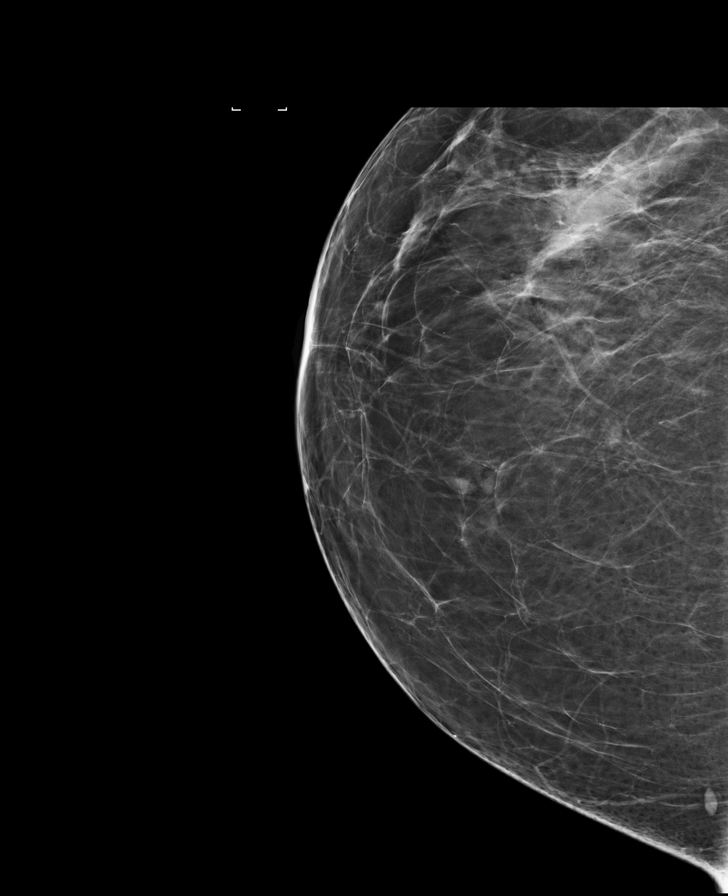

[R MLO (2 of 2)]
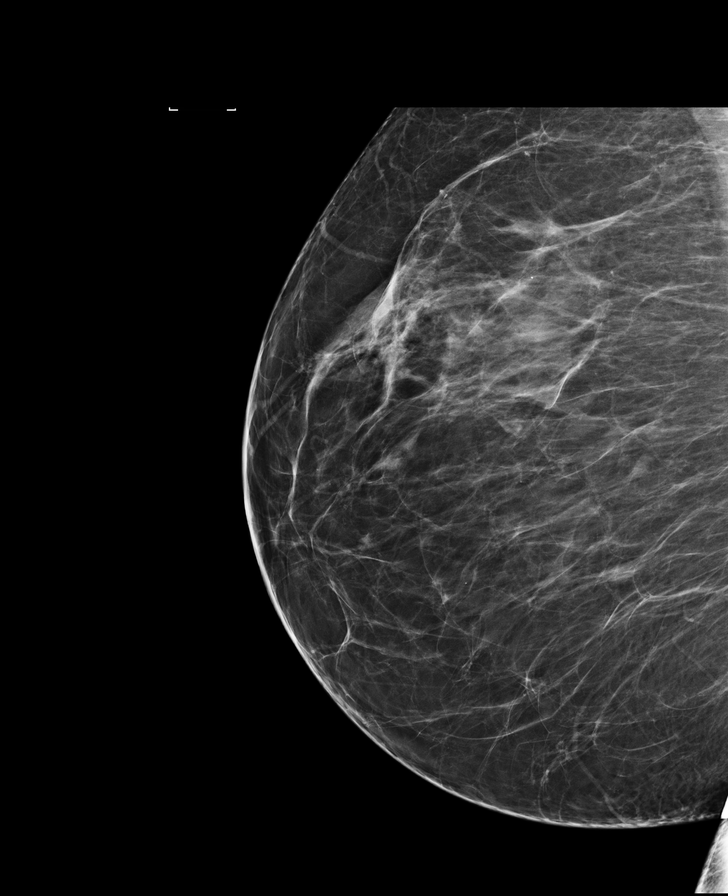

[L MLO (2 of 2)]
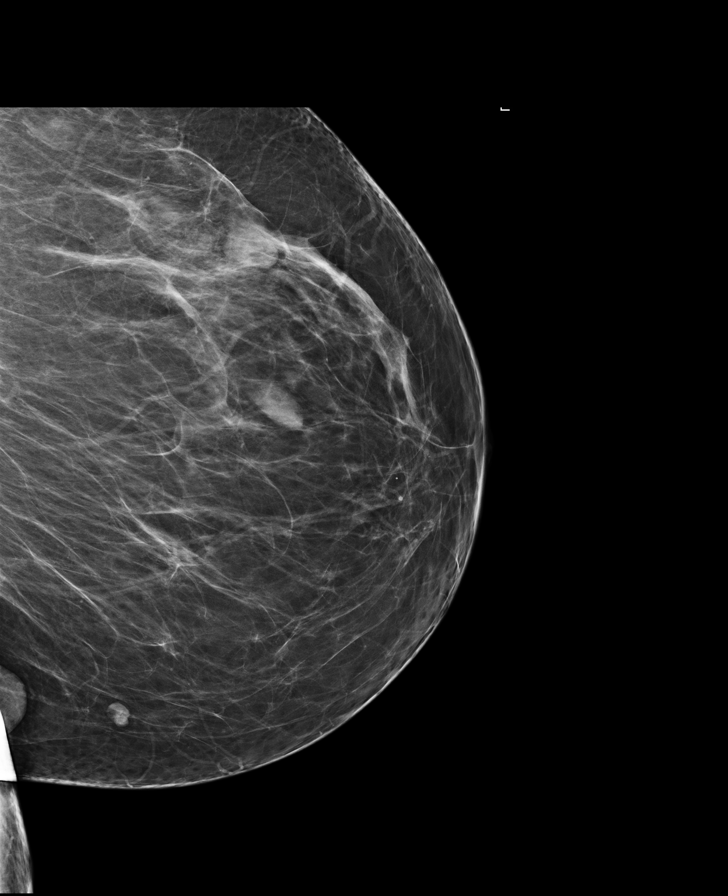

[L CC]
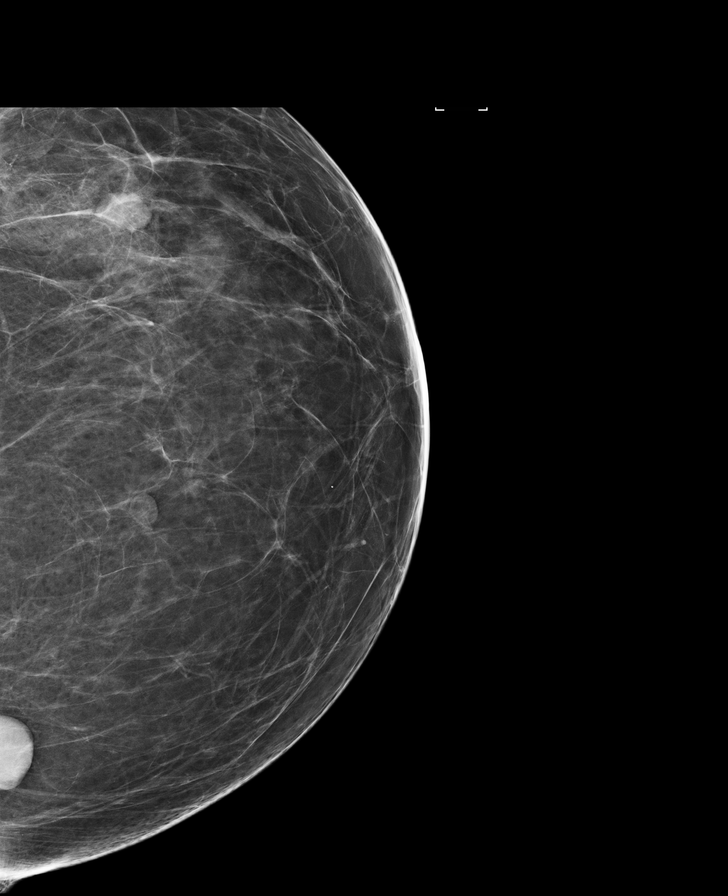

[R CC (2 of 2)]
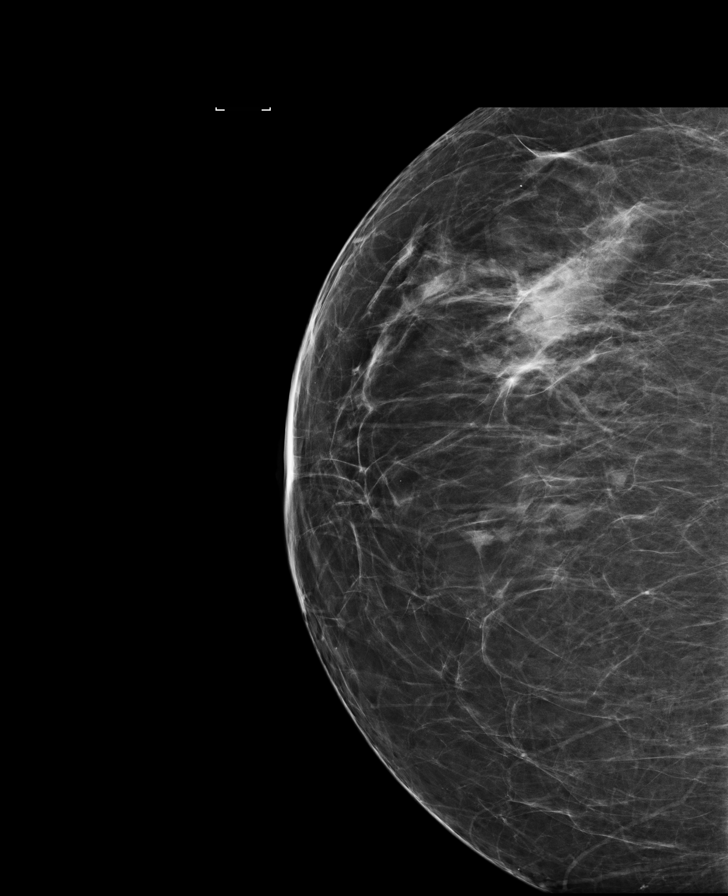

[R MLO tomo · tomo slice 43/85.0]
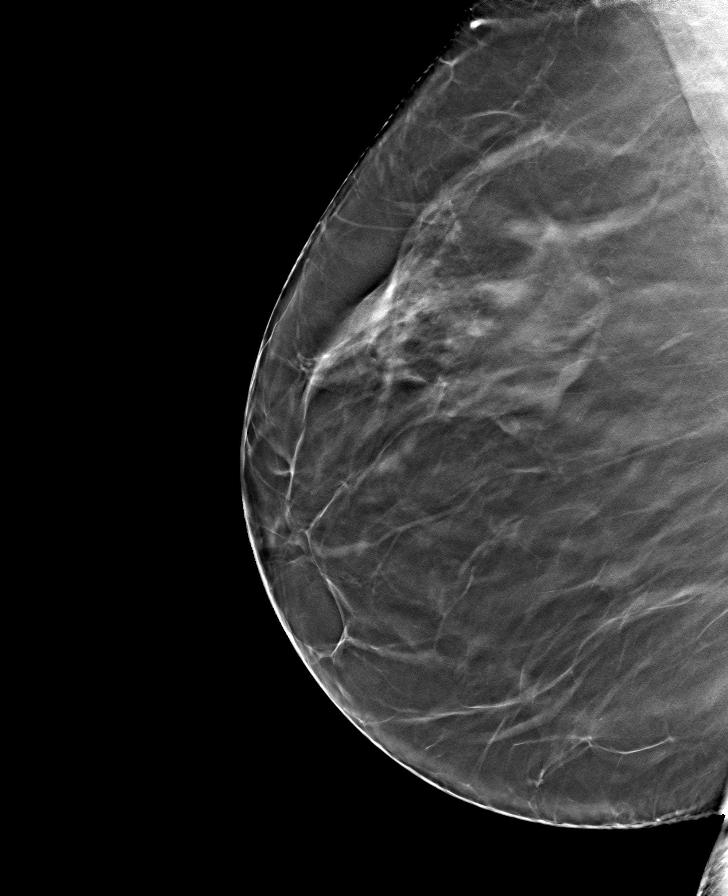

[8 of 27 positions shown; findings below may reference images not displayed]

ACR Breast Density Category b: There are scattered areas of
fibroglandular density.
FINDINGS: No suspicious masses or calcifications are seen in the right breast.
There is an oval circumscribed partially visualized mass in the
lower inner left breast measuring approximately 2 cm. The additional
oval circumscribed masses in the upper-outer left breast are stable
to slightly smaller in size dating back to at least 9109.

Mammographic images were processed with CAD.

Physical examination of the far inner left breast reveals a firm
slightly mobile mass. The patient states she has been feeling this
for the past 2-3 months.

Targeted ultrasound of the left breast was performed demonstrating a
round near isoechoic mass with internal vascularity at the 9 o'clock
position 10 cm from nipple measuring 2.2 x 2.1 x 2.2 cm. This
corresponds with mammography findings. No lymphadenopathy seen in
the left axilla.
IMPRESSION: Suspicious palpable left breast mass.

RECOMMENDATION:
Ultrasound-guided biopsy of the palpable mass in the far inner left
breast is recommended. This will be scheduled for the patient.

I have discussed the findings and recommendations with the patient.
Results were also provided in writing at the conclusion of the
visit. If applicable, a reminder letter will be sent to the patient
regarding the next appointment.

BI-RADS CATEGORY  4: Suspicious.

## 2019-12-23 ENCOUNTER — Encounter (HOSPITAL_COMMUNITY): Payer: Self-pay

## 2019-12-23 ENCOUNTER — Emergency Department (HOSPITAL_COMMUNITY)
Admission: EM | Admit: 2019-12-23 | Discharge: 2019-12-24 | Disposition: A | Discharge status: Home / Self Care | Payer: Medicaid Other | Attending: Emergency Medicine | Admitting: Emergency Medicine

## 2019-12-23 ENCOUNTER — Other Ambulatory Visit: Payer: Self-pay

## 2019-12-23 DIAGNOSIS — Z79899 Other long term (current) drug therapy: Secondary | ICD-10-CM | POA: Insufficient documentation

## 2019-12-23 DIAGNOSIS — L989 Disorder of the skin and subcutaneous tissue, unspecified: Secondary | ICD-10-CM

## 2019-12-23 DIAGNOSIS — J029 Acute pharyngitis, unspecified: Secondary | ICD-10-CM

## 2019-12-23 DIAGNOSIS — I10 Essential (primary) hypertension: Secondary | ICD-10-CM | POA: Insufficient documentation

## 2019-12-23 DIAGNOSIS — M545 Low back pain, unspecified: Secondary | ICD-10-CM

## 2019-12-23 DIAGNOSIS — N9089 Other specified noninflammatory disorders of vulva and perineum: Secondary | ICD-10-CM | POA: Insufficient documentation

## 2019-12-23 DIAGNOSIS — B373 Candidiasis of vulva and vagina: Secondary | ICD-10-CM | POA: Insufficient documentation

## 2019-12-23 DIAGNOSIS — Z9049 Acquired absence of other specified parts of digestive tract: Secondary | ICD-10-CM | POA: Insufficient documentation

## 2019-12-23 DIAGNOSIS — N949 Unspecified condition associated with female genital organs and menstrual cycle: Secondary | ICD-10-CM

## 2019-12-23 DIAGNOSIS — Z859 Personal history of malignant neoplasm, unspecified: Secondary | ICD-10-CM | POA: Insufficient documentation

## 2019-12-23 DIAGNOSIS — B3731 Acute candidiasis of vulva and vagina: Secondary | ICD-10-CM

## 2019-12-23 HISTORY — DX: Malignant (primary) neoplasm, unspecified: C80.1

## 2019-12-23 LAB — URINALYSIS, ROUTINE W REFLEX MICROSCOPIC
Bilirubin Urine: NEGATIVE
Glucose, UA: NEGATIVE mg/dL
Hgb urine dipstick: NEGATIVE
Ketones, ur: 5 mg/dL — AB
Leukocytes,Ua: NEGATIVE
Nitrite: NEGATIVE
Protein, ur: 30 mg/dL — AB
Specific Gravity, Urine: 1.024 (ref 1.005–1.030)
pH: 5 (ref 5.0–8.0)

## 2019-12-23 NOTE — ED Provider Notes (Signed)
[facility] EMERGENCY DEPARTMENT Provider Note   CSN: [id number] Arrival date & time: 12/23/19  1645     History Chief Complaint  Patient presents with  . Back Pain  . Vaginal Itching    [name] is a 49 y.o. female with history of anxiety, depression, hypertension, gastric ulcers presents for evaluation of multiple complaints.  She reports that she has a history of HPV and for the last 6 months she has had a lesion to the left groin that she noticed.  She went to the health department was started on antibiotics with no change but then noted a second lesion more recently.  She is concerned that she may be experiencing an HPV flare she denies any vaginal discharge but does note some vaginal itching over the last week or so.  She is also complaining of intermittent bilateral low back pains for approximately 1 week.  She denies any urinary symptoms or abdominal pain, nausea, or vomiting.  She has been helping a friend at home and has been lifting that friend so she thinks she could have injured her back while lifting them.  Denies any radiation of pain down the lower extremities.  Pain worsens with sitting upright, improves laying flat.  No bowel or bladder incontinence, saddle anesthesia, or IV drug use.  Has been taking Tylenol with some improvement.  For the last 2 weeks she has noted a couple of skin lesions to her chest wall and states they "feel warm".  She notes that they did drain some purulent fluid originally but this has resolved.  Reports low-grade fever up to 100.7 F over the last 2 weeks, well controlled on Tylenol.  She notes that the friend that she has been caring for has MRSA and is concerned that she may have MRSA.  She also notes sore throat for the last 3 days but also notes some nasal congestion and postnasal drip.  Denies difficulty swallowing.  Has not had any cough or shortness of breath.  Is been taking Mucinex with relief of symptoms.  No known sick contacts  and reports she is not concerned for Covid.   The history is provided by the patient.       Past Medical History:  Diagnosis Date  . Anxiety   . Arthritis   . Cancer ([place])   . Depression   . Hypertension   . Multiple gastric ulcers   . Seizures ([place])    had 1 seizure 9-10 yrs ago; unknown etiology and no meds. No more seizures    There are no problems to display for this patient.   Past Surgical History:  Procedure Laterality Date  . CHOLECYSTECTOMY    . EYE SURGERY Right    detatched retina  . INCISIONAL HERNIA REPAIR N/A 09/08/2014   Procedure: [name] HERNIORRHAPHY WITH MESH;  Surgeon: Jamesetta So, MD;  Location: [facility];  Service: General;  Laterality: N/A;  . INSERTION OF MESH N/A 09/08/2014   Procedure: INSERTION OF MESH;  Surgeon: Jamesetta So, MD;  Location: [facility];  Service: General;  Laterality: N/A;  . KNEE SURGERY       OB History   No obstetric history on file.     No family history on file.  Social History   Tobacco Use  . Smoking status: Never Smoker  . Smokeless tobacco: Never Used  Substance Use Topics  . Alcohol use: No  . Drug use: No    Home Medications Prior to Admission  medications   Medication Sig Start Date End Date Taking? Authorizing Provider  atorvastatin (LIPITOR) 20 MG tablet Take 1 Tablet by mouth once daily for cholesterol 04/03/17   [provider]  benzonatate (TESSALON) 100 MG capsule Take 1 capsule (100 mg total) by mouth 3 (three) times daily as needed for cough. 07/20/17   [name], DO  butalbital-acetaminophen-caffeine (FIORICET) [phone] MG tablet Take 1 tablet by mouth every 6 (six) hours as needed for headache. 06/06/19   Idol, Almyra Free, PA-C  clonazePAM (KLONOPIN) 1 MG tablet TAKE ONE TABLET BY MOUTH UP TO THREE TIMES DAILY AS NEEDED FOR ANXIETY 03/13/17   [provider]  cyclobenzaprine (FLEXERIL) 10 MG tablet Take 1 tablet (10 mg total) by mouth 2 (two) times daily as needed for muscle  spasms. 12/24/19   Nils Flack, Brixon Zhen A, PA-C  diphenhydrAMINE (BENADRYL) 25 MG tablet Take 1 tablet (25 mg total) by mouth every 4 (four) hours as needed. For itching 04/10/17   Triplett, Tammy, PA-C  etonogestrel (IMPLANON) 68 MG IMPL implant Inject 1 each into the skin once.    [provider]  HYDROcodone-acetaminophen (NORCO/VICODIN) 5-325 MG tablet Take 1 tablet by mouth every 4 (four) hours as needed. 02/25/18   [name], PA-C  hydrOXYzine (VISTARIL) 25 MG capsule Take 1 capsule (25 mg total) by mouth 3 (three) times daily as needed. 09/25/16   [name], PA-C  lidocaine (LIDODERM) 5 % Place 1 patch onto the skin daily. Remove & Discard patch within 12 hours or as directed by MD 10/02/18   Maczis, Barth Kirks, PA-C  Loratadine 10 MG CAPS Take 1 capsule (10 mg total) by mouth daily. 05/13/17   [name], DO  losartan (COZAAR) 50 MG tablet Take 50 mg by mouth daily.    [provider]  methocarbamol (ROBAXIN) 500 MG tablet Take 1 tablet (500 mg total) by mouth at bedtime as needed for muscle spasms. 10/02/18   Maczis, Barth Kirks, PA-C  PARoxetine (PAXIL) 40 MG tablet Take 60 mg by mouth daily.    [provider]  penicillin v potassium (VEETID) 250 MG tablet Take 1 tablet (250 mg total) by mouth 4 (four) times daily. 07/20/17   [name], DO  predniSONE (DELTASONE) 10 MG tablet Take 6 tablets day one, 5 tablets day two, 4 tablets day three, 3 tablets day four, 2 tablets day five, then 1 tablet day six 04/10/17   Triplett, Tammy, PA-C  risperiDONE (RISPERDAL) 1 MG tablet Take 1 tablet (1 mg total) by mouth daily. 03/31/19   Law, Bea Graff, PA-C  traMADol (ULTRAM) 50 MG tablet Take 1 tablet (50 mg total) by mouth every 6 (six) hours as needed. Patient not taking: Reported on 04/10/2017 12/17/16   [name], NP  traZODone (DESYREL) 100 MG tablet Take 300 mg by mouth at bedtime.    [provider]    Allergies    Naproxen and Ultram [tramadol hcl]  Review  of Systems   Review of Systems  Constitutional: Positive for fever. Negative for chills.  HENT: Positive for congestion and sore throat.   Respiratory: Negative for shortness of breath.   Cardiovascular: Negative for chest pain.  Gastrointestinal: Negative for abdominal pain, nausea and vomiting.  Genitourinary: Negative for dysuria, frequency, hematuria, urgency, vaginal discharge and vaginal pain.  Musculoskeletal: Positive for back pain.  Skin: Positive for color change and rash.  Neurological: Negative for weakness and numbness.  All other systems reviewed and are negative.  Physical Exam Updated Vital Signs BP 99/72 (BP Location: Right Arm)   Pulse 79   Temp 98.1 F (36.7 C) (Oral)   Resp 18   Ht 5\' 6"  (1.676 m)   Wt 122.9 kg   SpO2 100%   BMI 43.73 kg/m   Physical Exam Vitals and nursing note reviewed.  Constitutional:      General: She is not in acute distress.    Appearance: She is well-developed.  HENT:     Head: Normocephalic and atraumatic.     Nose: Congestion present.     Mouth/Throat:     Mouth: Mucous membranes are moist.     Pharynx: No oropharyngeal exudate or posterior oropharyngeal erythema.     Comments: No tonsillar hypertrophy, exudates, trismus, sublingual abnormalities, or uvular deviation.  Tolerating secretions without difficulty.  Mild postnasal drip noted. Eyes:     General:        Right eye: No discharge.        Left eye: No discharge.     Conjunctiva/sclera: Conjunctivae normal.  Neck:     Vascular: No JVD.     Trachea: No tracheal deviation.  Cardiovascular:     Rate and Rhythm: Normal rate and regular rhythm.     Pulses: Normal pulses.     Comments: 2+ DP/PT pulses bilaterally, Homans sign absent bilaterally, no lower extremity edema, no palpable cords, compartments are soft  Pulmonary:     Effort: Pulmonary effort is normal.     Breath sounds: Normal breath sounds.  Abdominal:     General: Bowel sounds are normal. There is no  distension.     Palpations: Abdomen is soft.     Tenderness: There is no abdominal tenderness. There is no guarding.  Genitourinary:    Comments: Examination performed in the presence of a chaperone.  Patient has 2 verrucous pedunculated lesions to the left labia.  No tenderness to palpation, erythema, or drainage.  Moderate amount of thick white discharge in the vaginal vault.  No cervical motion tenderness or adnexal tenderness. Musculoskeletal:        General: Tenderness present. Normal range of motion.     Cervical back: Neck supple.     Comments: No midline lumbar spine tenderness, no deformity, crepitus, or step-off.  Bilateral paralumbar muscle tenderness.  5/5 strength of BLE major muscle groups.  Skin:    General: Skin is warm and dry.     Comments: 2 lesions to the chest wall.  One lesion has central brown crusting and mild surrounding erythema.  No underlying fluctuance.  No drainage.  The other lesion measures approximately 1 cm and is just mildly erythematous, also has no underlying fluctuance or drainage  Neurological:     Mental Status: She is alert.     Comments: Sensation intact to light touch of bilateral lower extremities  Psychiatric:        Behavior: Behavior normal.     ED Results / Procedures / Treatments   Labs (all labs ordered are listed, but only abnormal results are displayed) Labs Reviewed  WET PREP, GENITAL - Abnormal; Notable for the following components:      Result Value   Yeast Wet Prep HPF POC PRESENT (*)    All other components within normal limits  URINALYSIS, ROUTINE W REFLEX MICROSCOPIC - Abnormal; Notable for the following components:   APPearance HAZY (*)    Ketones, ur 5 (*)    Protein, ur 30 (*)    Bacteria, UA RARE (*)  All other components within normal limits  GC/CHLAMYDIA PROBE AMP ([place]) NOT AT [facility]    EKG None  Radiology No results found.  Procedures Procedures (including critical care time)  Medications Ordered  in ED Medications  cyclobenzaprine (FLEXERIL) tablet 10 mg (10 mg Oral Given 12/24/19 0031)  fluconazole (DIFLUCAN) tablet 150 mg (150 mg Oral Given 12/24/19 0123)    ED Course  I have reviewed the triage vital signs and the nursing notes.  Pertinent labs & imaging results that were available during my care of the patient were reviewed by me and considered in my medical decision making (see chart for details).    MDM Rules/Calculators/A&P                      Patient presenting for evaluation of multiple complaints.  With regards to the rash on her chest, she has 2 distinct lesions, one with a honey colored crust and some mild surrounding erythema. No drainage. There does not appear to be any underlying abscess. Doubt SJS, TENS, [place] spotted fever, syphilis or other acute life-threatening dermatologic condition. She has been working with someone who has MRSA and I think it would be worth trialing a course of topical antibiotic such as bacitracin or mupirocin for this. Do not feel that she requires oral antibiotics.  With regards to her back pain she has no midline spine tenderness and she is neurovascularly intact and ambulatory in the ED. Doubt cauda equina or spinal abscess. Her UA is not concerning for UTI.  With regards to the lesions on her labia these appear consistent with HPV or other genital wart. Does not appear to be herpetic or syphilitic in nature. Her pelvic examination is reassuring with no concern for PID but she does have yeast on wet prep so we will treat with single dose of Diflucan.  With regards to her sore throat, no evidence of strep pharyngitis, peritonsillar abscess or retropharyngeal abscess. No nuchal rigidity or meningeal signs to suggest meningitis. She has had improvement in her symptoms with over-the-counter medications and I encouraged her to continue with symptomatic management.  Recommend follow-up with PCP and OB/GYN for reevaluation of symptoms.  Discussed strict ED return precautions. Patient verbalized understanding of and agreement with plan and is safe for discharge home at this time.   Final Clinical Impression(s) / ED Diagnoses Final diagnoses:  Acute bilateral low back pain without sciatica  Acute sore throat  Skin lesion of chest wall  Genital lesion, female  Yeast vaginitis    Rx / DC Orders ED Discharge Orders         Ordered    cyclobenzaprine (FLEXERIL) 10 MG tablet  2 times daily PRN     12/24/19 0058           Renita Papa, PA-C 12/24/19 1623    Orpah Greek, MD 12/25/19 [phone]

## 2019-12-23 NOTE — ED Triage Notes (Signed)
Pt to er, pt states that she has some back pain and some vaginal itching.  States that she has had some spots on her groin and went to [facility] and was given an abx without improvement, states that she has a friend with MRSA, pt has some sores and is wanting to know if they are MRSA.  Denies UTI symptoms. States that she just has some itchiness.

## 2019-12-24 LAB — WET PREP, GENITAL
Clue Cells Wet Prep HPF POC: NONE SEEN
Sperm: NONE SEEN
Trich, Wet Prep: NONE SEEN
WBC, Wet Prep HPF POC: NONE SEEN

## 2019-12-24 MED ORDER — CYCLOBENZAPRINE HCL 10 MG PO TABS: 10.0000 mg | ORAL_TABLET | Freq: Two times a day (BID) | ORAL | 0 refills | Status: DC | PRN

## 2019-12-24 MED ORDER — CYCLOBENZAPRINE HCL 10 MG PO TABS
10.0000 mg | ORAL_TABLET | Freq: Once | ORAL | Status: AC
  Administered 2019-12-24: 10 mg via ORAL
  Filled 2019-12-24: qty 1

## 2019-12-24 MED ORDER — FLUCONAZOLE 150 MG PO TABS
150.0000 mg | ORAL_TABLET | Freq: Once | ORAL | Status: AC
  Administered 2019-12-24: 150 mg via ORAL
  Filled 2019-12-24: qty 1

## 2019-12-24 NOTE — Discharge Instructions (Signed)
Your wet prep showed evidence of a yeast infection.  This was treated with a one-time dose of Diflucan in the emergency department.  Your sore throat can be treated with warm water salt gargles, warm teas, honey, cough drops/throat lozenges.  You can also continue using Mucinex over-the-counter.  Drink plenty fluids and get rest.  With regards to your back pain, you can alternate 600 mg of ibuprofen and [phone] mg of Tylenol every 3 hours as needed for pain. Do not exceed 4000 mg of Tylenol daily.  Take ibuprofen with food to avoid upset stomach issues.  You can take Flexeril as needed for muscle relaxation but I typically recommend only taking this medicine at night to help you get to sleep as it can cause drowsiness.  If you take it during the day do not drive, drink alcohol, or operate heavy machinery due to the sedating side effects.  Try not to stay still for too long and do some gentle stretching throughout the day to avoid muscle stiffness.  You can also apply ice or heat to areas of pain, 20 minutes at a time a few times daily.  Apply the mupirocin ointment to the affected areas on the chest wall twice daily.  Keep the areas clean and dry.  Seek evaluation if the areas worsen.  Follow-up with the OB/GYN for reevaluation of the skin lesions on the left labia.  Return to the emergency department if any concerning signs or symptoms develop.

## 2019-12-25 LAB — GC/CHLAMYDIA PROBE AMP (~~LOC~~) NOT AT ARMC
Chlamydia: NEGATIVE
Neisseria Gonorrhea: NEGATIVE

## 2020-10-28 NOTE — Congregational Nurse Program (Signed)
  Dept: [phone]   Congregational Nurse Program Note  Date of Encounter: 10/28/2020  Past Medical History: Past Medical History:  Diagnosis Date  . Anxiety   . Arthritis   . Cancer ([place])   . Depression   . Hypertension   . Multiple gastric ulcers   . Seizures ([place])    had 1 seizure 9-10 yrs ago; unknown etiology and no meds. No more seizures    Encounter Details:  CNP Questionnaire - 10/28/20 1520      Questionnaire   Do you give verbal consent to treat you today? Yes    Visit [facility] or Counselling psychologist Patient Served At [employer], [place]    Patient Status Not Applicable    Medical Provider Yes    Insurance Medicaid    Intervention Assess (including screenings)           Stated she is doing well today. Has history of hypertension and takes her medication as ordered. We discussed importance of eating a healthy heart diet and exercise.BP110/75 P-67 Erma Heritage RN, [place], [phone]

## 2020-11-28 ENCOUNTER — Encounter (HOSPITAL_COMMUNITY): Payer: Self-pay | Admitting: *Deleted

## 2020-11-28 ENCOUNTER — Emergency Department (HOSPITAL_COMMUNITY)
Admission: EM | Admit: 2020-11-28 | Discharge: 2020-11-28 | Disposition: A | Discharge status: Home / Self Care | Payer: 59 | Attending: Emergency Medicine | Admitting: Emergency Medicine

## 2020-11-28 ENCOUNTER — Other Ambulatory Visit: Payer: Self-pay

## 2020-11-28 DIAGNOSIS — I10 Essential (primary) hypertension: Secondary | ICD-10-CM | POA: Diagnosis not present

## 2020-11-28 DIAGNOSIS — Z79899 Other long term (current) drug therapy: Secondary | ICD-10-CM | POA: Diagnosis not present

## 2020-11-28 DIAGNOSIS — K0889 Other specified disorders of teeth and supporting structures: Secondary | ICD-10-CM | POA: Diagnosis present

## 2020-11-28 DIAGNOSIS — Z85828 Personal history of other malignant neoplasm of skin: Secondary | ICD-10-CM | POA: Diagnosis not present

## 2020-11-28 MED ORDER — HYDROCODONE-ACETAMINOPHEN 5-325 MG PO TABS
1.0000 | ORAL_TABLET | Freq: Once | ORAL | Status: AC
  Administered 2020-11-28: 1 via ORAL
  Filled 2020-11-28: qty 1

## 2020-11-28 NOTE — ED Triage Notes (Signed)
States her tooth broke off tonight while eating. Left lower jaw pain

## 2020-11-28 NOTE — Discharge Instructions (Addendum)
I recommend a combination of tylenol and ibuprofen for management of your pain. You can take a low dose of both at the same time. I recommend 325 mg of Tylenol combined with 400 mg of ibuprofen. This is one regular Tylenol and two regular ibuprofen. You can take these 2-3 times for day for your pain. Please try to take these medications with a small amount of food as well to prevent upsetting your stomach.  Also consider salt water gargles, applying Orajel as well as clove oil to the affected region.  If you develop new or worsening symptoms such as swelling, difficulty swallowing, sore throat, shortness of breath, drooling, please return to the emergency department immediately for reevaluation.  Below is the contact information for another dentist.  Please give them a call and try to schedule a follow-up appointment.  It was a pleasure to meet you.

## 2020-11-28 NOTE — ED Provider Notes (Signed)
[facility] EMERGENCY DEPARTMENT Provider Note   CSN: [id number] Arrival date & time: 11/28/20  1728     History Chief Complaint  Patient presents with  . Dental Pain    [name] is a 50 y.o. female.  HPI Patient is a 50 year old female who presents the emergency department with dental pain.  Patient reports a history of extremely poor dentition and states that she was chewing on a piece of chicken earlier tonight and a portion of her left lower premolar broke off.  She reports exquisite pain in the region.  No swelling, drooling, difficulty swallowing, sore throat, chest pain, shortness of breath.  Patient has no other complaints at this time.  She states that she has reached out to dental providers in the area and has an appointment in the next month to have the affected tooth removed.    Past Medical History:  Diagnosis Date  . Anxiety   . Arthritis   . Cancer ([place])   . Depression   . Hypertension   . Multiple gastric ulcers   . Seizures ([place])    had 1 seizure 9-10 yrs ago; unknown etiology and no meds. No more seizures    There are no problems to display for this patient.   Past Surgical History:  Procedure Laterality Date  . CHOLECYSTECTOMY    . EYE SURGERY Right    detatched retina  . INCISIONAL HERNIA REPAIR N/A 09/08/2014   Procedure: [name] HERNIORRHAPHY WITH MESH;  Surgeon: Jamesetta So, MD;  Location: [facility];  Service: General;  Laterality: N/A;  . INSERTION OF MESH N/A 09/08/2014   Procedure: INSERTION OF MESH;  Surgeon: Jamesetta So, MD;  Location: [facility];  Service: General;  Laterality: N/A;  . KNEE SURGERY       OB History   No obstetric history on file.     No family history on file.  Social History   Tobacco Use  . Smoking status: Never Smoker  . Smokeless tobacco: Never Used  Substance Use Topics  . Alcohol use: No  . Drug use: No    Home Medications Prior to Admission medications   Medication Sig Start Date End Date  Taking? Authorizing Provider  atorvastatin (LIPITOR) 20 MG tablet Take 1 Tablet by mouth once daily for cholesterol 04/03/17   [provider]  benzonatate (TESSALON) 100 MG capsule Take 1 capsule (100 mg total) by mouth 3 (three) times daily as needed for cough. 07/20/17   [name], DO  butalbital-acetaminophen-caffeine (FIORICET) [phone] MG tablet Take 1 tablet by mouth every 6 (six) hours as needed for headache. 06/06/19   Idol, Almyra Free, PA-C  clonazePAM (KLONOPIN) 1 MG tablet TAKE ONE TABLET BY MOUTH UP TO THREE TIMES DAILY AS NEEDED FOR ANXIETY 03/13/17   [provider]  cyclobenzaprine (FLEXERIL) 10 MG tablet Take 1 tablet (10 mg total) by mouth 2 (two) times daily as needed for muscle spasms. 12/24/19   Nils Flack, Mina A, PA-C  diphenhydrAMINE (BENADRYL) 25 MG tablet Take 1 tablet (25 mg total) by mouth every 4 (four) hours as needed. For itching 04/10/17   Triplett, Tammy, PA-C  etonogestrel (IMPLANON) 68 MG IMPL implant Inject 1 each into the skin once.    [provider]  HYDROcodone-acetaminophen (NORCO/VICODIN) 5-325 MG tablet Take 1 tablet by mouth every 4 (four) hours as needed. 02/25/18   [name], PA-C  hydrOXYzine (VISTARIL) 25 MG capsule Take 1 capsule (25 mg total) by mouth 3 (three) times  daily as needed. 09/25/16   [name], PA-C  lidocaine (LIDODERM) 5 % Place 1 patch onto the skin daily. Remove & Discard patch within 12 hours or as directed by MD 10/02/18   Maczis, Barth Kirks, PA-C  Loratadine 10 MG CAPS Take 1 capsule (10 mg total) by mouth daily. 05/13/17   [name], DO  losartan (COZAAR) 50 MG tablet Take 50 mg by mouth daily.    [provider]  methocarbamol (ROBAXIN) 500 MG tablet Take 1 tablet (500 mg total) by mouth at bedtime as needed for muscle spasms. 10/02/18   Maczis, Barth Kirks, PA-C  PARoxetine (PAXIL) 40 MG tablet Take 60 mg by mouth daily.    [provider]  penicillin v potassium (VEETID) 250 MG tablet Take  1 tablet (250 mg total) by mouth 4 (four) times daily. 07/20/17   [name], DO  predniSONE (DELTASONE) 10 MG tablet Take 6 tablets day one, 5 tablets day two, 4 tablets day three, 3 tablets day four, 2 tablets day five, then 1 tablet day six 04/10/17   Triplett, Tammy, PA-C  risperiDONE (RISPERDAL) 1 MG tablet Take 1 tablet (1 mg total) by mouth daily. 03/31/19   Law, Bea Graff, PA-C  traMADol (ULTRAM) 50 MG tablet Take 1 tablet (50 mg total) by mouth every 6 (six) hours as needed. Patient not taking: Reported on 04/10/2017 12/17/16   [name], NP  traZODone (DESYREL) 100 MG tablet Take 300 mg by mouth at bedtime.    [provider]    Allergies    Naproxen and Ultram [tramadol hcl]  Review of Systems   Review of Systems  Constitutional: Negative for chills and fever.  HENT: Positive for dental problem. Negative for sore throat and trouble swallowing.   Respiratory: Negative for shortness of breath.   Cardiovascular: Negative for chest pain.   Physical Exam Updated Vital Signs BP (!) 109/92   Pulse 70   Temp 97.7 F (36.5 C) (Oral)   Resp 18   Ht 5\' 6"  (1.676 m)   Wt 129.3 kg   SpO2 92%   BMI 46.00 kg/m   Physical Exam Vitals and nursing note reviewed.  Constitutional:      General: She is not in acute distress.    Appearance: She is well-developed.  HENT:     Head: Normocephalic and atraumatic.     Right Ear: External ear normal.     Left Ear: External ear normal.     Mouth/Throat:     Comments: Extremely poor dentition with few remaining teeth.  Significant tooth decay.  Affected tooth appears to be the left lower first premolar.  Mild tenderness with manipulation of the tooth.  No surrounding erythema, swelling, or fluctuance.  Soft submental and sublingual spaces.  Readily handling secretions.  No hot potato voice. Eyes:     General: No scleral icterus.       Right eye: No discharge.        Left eye: No discharge.     Conjunctiva/sclera:  Conjunctivae normal.  Neck:     Trachea: No tracheal deviation.     Comments: No cervical adenopathy.  Neck is supple.  Full range of motion. Cardiovascular:     Rate and Rhythm: Normal rate.  Pulmonary:     Effort: Pulmonary effort is normal. No respiratory distress.     Breath sounds: No stridor.  Abdominal:     General: There is no distension.  Musculoskeletal:  General: No swelling or deformity.     Cervical back: Normal range of motion and neck supple. No rigidity or tenderness.  Lymphadenopathy:     Cervical: No cervical adenopathy.  Skin:    General: Skin is warm and dry.     Findings: No rash.  Neurological:     Mental Status: She is alert.     Cranial Nerves: Cranial nerve deficit: no gross deficits.    ED Results / Procedures / Treatments   Labs (all labs ordered are listed, but only abnormal results are displayed) Labs Reviewed - No data to display  EKG None  Radiology No results found.  Procedures Procedures   Medications Ordered in ED Medications  HYDROcodone-acetaminophen (NORCO/VICODIN) 5-325 MG per tablet 1 tablet (1 tablet Oral Given 11/28/20 1753)   ED Course  I have reviewed the triage vital signs and the nursing notes.  Pertinent labs & imaging results that were available during my care of the patient were reviewed by me and considered in my medical decision making (see chart for details).    MDM Rules/Calculators/A&P                           Patient is a 50 year old female who presents the emergency department with pain secondary to a broken left lower premolar.  Very poor dentition on my exam.  Otherwise, reassuring physical exam.  Exam nonconcerning for Ludwig's angina or PTA at this time.  No signs of dental infection.  Readily handling secretions.  Patient denies any other systemic complaints.  Patient given a dose of Vicodin here in the emergency department for acute pain.  She has a ride home.  Recommended OTC pain medications  moving forward.  We discussed dosing.  Orajel as well as clove oil.  Salt water gargles.  Patient given additional dental follow-up.  Discussed return precautions in length.  Her questions were answered and she was amicable at the time of discharge.  Final Clinical Impression(s) / ED Diagnoses Final diagnoses:  Pain, dental   Rx / DC Orders ED Discharge Orders    None       Rayna Sexton, PA-C 11/28/20 1806    Noemi Chapel, MD 11/29/20 [phone]

## 2021-02-24 ENCOUNTER — Other Ambulatory Visit (HOSPITAL_COMMUNITY): Payer: Self-pay | Admitting: Nurse Practitioner

## 2021-02-24 DIAGNOSIS — Z1231 Encounter for screening mammogram for malignant neoplasm of breast: Secondary | ICD-10-CM

## 2021-03-03 ENCOUNTER — Ambulatory Visit (HOSPITAL_COMMUNITY): Payer: 59

## 2021-04-19 ENCOUNTER — Ambulatory Visit (HOSPITAL_COMMUNITY): Payer: 59

## 2021-04-26 ENCOUNTER — Other Ambulatory Visit (HOSPITAL_COMMUNITY): Payer: Self-pay | Admitting: Family

## 2021-04-26 ENCOUNTER — Other Ambulatory Visit: Payer: Self-pay

## 2021-04-26 ENCOUNTER — Ambulatory Visit (HOSPITAL_COMMUNITY)
Admission: RE | Admit: 2021-04-26 | Discharge: 2021-04-26 | Disposition: A | Discharge status: Home / Self Care | Payer: 59 | Source: Ambulatory Visit | Attending: Nurse Practitioner | Admitting: Nurse Practitioner

## 2021-04-26 DIAGNOSIS — Z1231 Encounter for screening mammogram for malignant neoplasm of breast: Secondary | ICD-10-CM | POA: Insufficient documentation

## 2021-12-24 DIAGNOSIS — M5459 Other low back pain: Secondary | ICD-10-CM | POA: Diagnosis not present

## 2021-12-24 DIAGNOSIS — M5451 Vertebrogenic low back pain: Secondary | ICD-10-CM | POA: Diagnosis not present

## 2021-12-30 DIAGNOSIS — M170 Bilateral primary osteoarthritis of knee: Secondary | ICD-10-CM | POA: Diagnosis not present

## 2021-12-30 DIAGNOSIS — M25561 Pain in right knee: Secondary | ICD-10-CM | POA: Diagnosis not present

## 2021-12-30 DIAGNOSIS — M25562 Pain in left knee: Secondary | ICD-10-CM | POA: Diagnosis not present

## 2022-01-18 DIAGNOSIS — M549 Dorsalgia, unspecified: Secondary | ICD-10-CM | POA: Diagnosis not present

## 2022-01-18 DIAGNOSIS — M25561 Pain in right knee: Secondary | ICD-10-CM | POA: Diagnosis not present

## 2022-01-18 DIAGNOSIS — R69 Illness, unspecified: Secondary | ICD-10-CM | POA: Diagnosis not present

## 2022-01-18 DIAGNOSIS — F319 Bipolar disorder, unspecified: Secondary | ICD-10-CM | POA: Diagnosis not present

## 2022-02-25 DIAGNOSIS — Z124 Encounter for screening for malignant neoplasm of cervix: Secondary | ICD-10-CM | POA: Diagnosis not present

## 2022-02-25 DIAGNOSIS — R69 Illness, unspecified: Secondary | ICD-10-CM | POA: Diagnosis not present

## 2022-03-28 ENCOUNTER — Encounter: Payer: 59 | Admitting: Obstetrics & Gynecology

## 2022-04-15 ENCOUNTER — Encounter: Payer: 59 | Admitting: Obstetrics & Gynecology

## 2022-04-25 LAB — TSH: TSH: 1.01 (ref 0.41–5.90)

## 2022-06-14 ENCOUNTER — Encounter: Payer: 59 | Admitting: Obstetrics & Gynecology

## 2022-07-26 LAB — TSH: TSH: 1.34 (ref 0.41–5.90)

## 2022-07-29 ENCOUNTER — Ambulatory Visit (INDEPENDENT_AMBULATORY_CARE_PROVIDER_SITE_OTHER): Payer: 59 | Admitting: Obstetrics & Gynecology

## 2022-07-29 ENCOUNTER — Encounter: Payer: Self-pay | Admitting: Obstetrics & Gynecology

## 2022-07-29 ENCOUNTER — Other Ambulatory Visit (HOSPITAL_COMMUNITY)
Admission: RE | Admit: 2022-07-29 | Discharge: 2022-07-29 | Disposition: A | Discharge status: Home / Self Care | Payer: 59 | Source: Ambulatory Visit | Attending: Obstetrics & Gynecology | Admitting: Obstetrics & Gynecology

## 2022-07-29 VITALS — BP 110/80 | HR 78 | Ht 66.0 in | Wt 267.1 lb

## 2022-07-29 DIAGNOSIS — Z1211 Encounter for screening for malignant neoplasm of colon: Secondary | ICD-10-CM | POA: Diagnosis not present

## 2022-07-29 DIAGNOSIS — Z01419 Encounter for gynecological examination (general) (routine) without abnormal findings: Secondary | ICD-10-CM

## 2022-07-29 DIAGNOSIS — Z1231 Encounter for screening mammogram for malignant neoplasm of breast: Secondary | ICD-10-CM

## 2022-07-29 NOTE — Progress Notes (Signed)
   WELL-WOMAN EXAMINATION Patient name: [name] MRN [id number]  Date of birth: 04-Dec-1970 Chief Complaint:   Annual Exam  History of Present Illness:   [name] is a 51 y.o. [phone] female being seen today for a routine well-woman exam.  Today she notes no acute complaints  -Cervical dysplasia: reports h/o abnormal pap about 65yr ago notes a colposcopy, which was negative- completed at health department  -Contraception: Nexplanon- due to be replaced in 2025.  With this form of contraception, she does not have a period.  Denies discharge, itching or irritation.  Denies pelvic or abdominal pain.  No LMP recorded. Patient has had an implant. Denies issues with her menses The current method of family planning is Nexplanon.   Notes strong family h/o cancer  Last pap unknown.  Last mammogram: 2022. Last colonoscopy: not yet completed     07/29/2022   10:14 AM  Depression screen PHQ 2/9  Decreased Interest 0  Down, Depressed, Hopeless 1  PHQ - 2 Score 1  Altered sleeping 2  Tired, decreased energy 3  Change in appetite 3  Feeling bad or failure about yourself  1  Trouble concentrating 2  Moving slowly or fidgety/restless 2  Suicidal thoughts 0  PHQ-9 Score 14      Review of Systems:   Pertinent items are noted in HPI Denies any headaches, blurred vision, fatigue, shortness of breath, chest pain, abdominal pain, bowel movements, urination, or intercourse unless otherwise stated above.  Pertinent History Reviewed:  Reviewed past medical,surgical, social and family history.  Reviewed problem list, medications and allergies. Physical Assessment:   Vitals:   07/29/22 1013  BP: 110/80  Pulse: 78  Weight: 267 lb 2 oz (121.2 kg)  Height: '5\' 6"'$  (1.676 m)  Body mass index is 43.12 kg/m.        Physical Examination:   General appearance - well appearing, and in no distress  Mental status - alert, oriented to person, place, and time  Psych:  She has a  normal mood and affect  Skin - warm and dry, normal color, no suspicious lesions noted  Chest - effort normal, all lung fields clear to auscultation bilaterally  Heart - normal rate and regular rhythm  Neck:  midline trachea, no thyromegaly or nodules  Breasts - breasts appear normal, no suspicious masses, no skin or nipple changes or  axillary nodes  Abdomen - obese, soft, nontender, nondistended, no masses or organomegaly  Pelvic - VULVA: normal appearing vulva with no masses, tenderness or lesions  VAGINA: normal appearing vagina with normal color and discharge, no lesions  CERVIX: normal appearing cervix without discharge or lesions, no CMT  Thin prep pap is done with HR HPV cotesting  UTERUS: uterus is felt to be normal size, shape, consistency and nontender   ADNEXA: No adnexal masses or tenderness noted. **LONG SPECULUM USED  Extremities:  No swelling or varicosities noted, left arm with nexplanon  Chaperone:  pt declined      Assessment & Plan:  1) Well-Woman Exam -pap collected -mammogram ordered -cologuard ordered  2) Contraceptive management -Nexplanon in place, due for replacement in 279yr Orders Placed This Encounter  Procedures   MM 3D SCREEN BREAST BILATERAL   Cologuard    Follow-up: Return in about 1 year (around 07/30/2023) for Annual.   JeJanyth PupaDO Attending [facility]

## 2022-07-29 NOTE — Patient Instructions (Signed)
Please schedule a mammogram at one of the following locations:  [place]: [phone]  [facility] [place]:[phone] [address]  

## 2022-08-04 LAB — CYTOLOGY - PAP
Comment: NEGATIVE
Diagnosis: NEGATIVE
High risk HPV: NEGATIVE

## 2022-10-23 ENCOUNTER — Ambulatory Visit
Admission: EM | Admit: 2022-10-23 | Discharge: 2022-10-23 | Disposition: A | Discharge status: Home / Self Care | Payer: 59 | Attending: Urgent Care | Admitting: Urgent Care

## 2022-10-23 ENCOUNTER — Telehealth: Payer: Self-pay | Admitting: Emergency Medicine

## 2022-10-23 ENCOUNTER — Encounter: Payer: Self-pay | Admitting: Emergency Medicine

## 2022-10-23 DIAGNOSIS — F319 Bipolar disorder, unspecified: Secondary | ICD-10-CM | POA: Diagnosis not present

## 2022-10-23 DIAGNOSIS — U071 COVID-19: Secondary | ICD-10-CM | POA: Diagnosis not present

## 2022-10-23 MED ORDER — PAXLOVID (300/100) 20 X 150 MG & 10 X 100MG PO TBPK: ORAL_TABLET | ORAL | 0 refills | Status: DC
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
## 2022-10-23 NOTE — Telephone Encounter (Signed)
Patient wants paxlovid sent to [facility] since [facility] is closed

## 2022-10-23 NOTE — Discharge Instructions (Signed)
Start Paxlovid for your COVID 19 infection. Do not take atorvastatin (cholesterol), clonazepam (anxiety) medications while on Paxlovid. Take 1/2 your dose of trazodone while on Paxlovid.

## 2022-10-23 NOTE — ED Triage Notes (Signed)
Positive home covid test last night.  Cough, runny nose, body aches since Friday

## 2023-01-02 ENCOUNTER — Other Ambulatory Visit (HOSPITAL_COMMUNITY): Payer: Self-pay | Admitting: Nurse Practitioner

## 2023-01-02 DIAGNOSIS — N6310 Unspecified lump in the right breast, unspecified quadrant: Secondary | ICD-10-CM

## 2023-01-17 ENCOUNTER — Other Ambulatory Visit (HOSPITAL_COMMUNITY): Payer: Self-pay | Admitting: Internal Medicine

## 2023-01-17 DIAGNOSIS — R221 Localized swelling, mass and lump, neck: Secondary | ICD-10-CM

## 2023-01-19 ENCOUNTER — Ambulatory Visit (HOSPITAL_COMMUNITY)
Admission: RE | Admit: 2023-01-19 | Discharge: 2023-01-19 | Disposition: A | Discharge status: Home / Self Care | Payer: Medicaid Other | Source: Ambulatory Visit | Attending: Nurse Practitioner | Admitting: Nurse Practitioner

## 2023-01-19 ENCOUNTER — Encounter (HOSPITAL_COMMUNITY): Payer: Self-pay

## 2023-01-19 ENCOUNTER — Other Ambulatory Visit (HOSPITAL_COMMUNITY): Payer: Self-pay | Admitting: Internal Medicine

## 2023-01-19 ENCOUNTER — Ambulatory Visit (HOSPITAL_COMMUNITY)
Admission: RE | Admit: 2023-01-19 | Discharge: 2023-01-19 | Disposition: A | Discharge status: Home / Self Care | Payer: Medicaid Other | Source: Ambulatory Visit | Attending: Internal Medicine | Admitting: Internal Medicine

## 2023-01-19 ENCOUNTER — Other Ambulatory Visit (HOSPITAL_COMMUNITY): Payer: Self-pay | Admitting: Nurse Practitioner

## 2023-01-19 DIAGNOSIS — R221 Localized swelling, mass and lump, neck: Secondary | ICD-10-CM

## 2023-01-19 DIAGNOSIS — R928 Other abnormal and inconclusive findings on diagnostic imaging of breast: Secondary | ICD-10-CM

## 2023-01-19 DIAGNOSIS — N6310 Unspecified lump in the right breast, unspecified quadrant: Secondary | ICD-10-CM

## 2023-01-23 ENCOUNTER — Other Ambulatory Visit (HOSPITAL_COMMUNITY): Payer: Self-pay | Admitting: Nurse Practitioner

## 2023-01-23 DIAGNOSIS — R928 Other abnormal and inconclusive findings on diagnostic imaging of breast: Secondary | ICD-10-CM

## 2023-01-24 ENCOUNTER — Ambulatory Visit (HOSPITAL_COMMUNITY)
Admission: RE | Admit: 2023-01-24 | Discharge: 2023-01-24 | Disposition: A | Discharge status: Home / Self Care | Payer: Medicaid Other | Source: Ambulatory Visit | Attending: Nurse Practitioner | Admitting: Nurse Practitioner

## 2023-01-24 ENCOUNTER — Encounter (HOSPITAL_COMMUNITY): Payer: Self-pay

## 2023-01-24 DIAGNOSIS — R928 Other abnormal and inconclusive findings on diagnostic imaging of breast: Secondary | ICD-10-CM | POA: Insufficient documentation

## 2023-01-24 HISTORY — PX: BREAST BIOPSY: SHX20

## 2023-01-24 MED ORDER — LIDOCAINE HCL (PF) 2 % IJ SOLN
10.0000 mL | Freq: Once | INTRAMUSCULAR | Status: AC
  Administered 2023-01-24: 10 mL

## 2023-01-24 MED ORDER — LIDOCAINE-EPINEPHRINE (PF) 1 %-1:200000 IJ SOLN
INTRAMUSCULAR | Status: AC
  Filled 2023-01-24: qty 30

## 2023-01-24 MED ORDER — LIDOCAINE HCL (PF) 2 % IJ SOLN
INTRAMUSCULAR | Status: AC
  Filled 2023-01-24: qty 10

## 2023-01-24 MED ORDER — LIDOCAINE-EPINEPHRINE (PF) 1 %-1:200000 IJ SOLN
10.0000 mL | Freq: Once | INTRAMUSCULAR | Status: AC
  Administered 2023-01-24: 10 mL via INTRADERMAL

## 2023-01-24 NOTE — Progress Notes (Signed)
PT tolerated right breast biopsy well today with NAD noted. PT verbalized understanding of discharge instructions. PT ambulated back to the mammogram area this time and given ice packs.. 

## 2023-01-25 ENCOUNTER — Encounter: Payer: Self-pay | Admitting: *Deleted

## 2023-01-25 LAB — SURGICAL PATHOLOGY

## 2023-02-14 ENCOUNTER — Other Ambulatory Visit (HOSPITAL_COMMUNITY)
Admission: RE | Admit: 2023-02-14 | Discharge: 2023-02-14 | Disposition: A | Discharge status: Home / Self Care | Payer: Medicaid Other | Source: Ambulatory Visit | Attending: Nurse Practitioner | Admitting: Nurse Practitioner

## 2023-02-14 ENCOUNTER — Ambulatory Visit: Payer: Medicaid Other | Admitting: Internal Medicine

## 2023-02-14 ENCOUNTER — Other Ambulatory Visit: Payer: Self-pay | Admitting: Nurse Practitioner

## 2023-02-14 DIAGNOSIS — L989 Disorder of the skin and subcutaneous tissue, unspecified: Secondary | ICD-10-CM | POA: Diagnosis present

## 2023-02-15 ENCOUNTER — Other Ambulatory Visit (HOSPITAL_COMMUNITY)
Admission: RE | Admit: 2023-02-15 | Discharge: 2023-02-15 | Disposition: A | Discharge status: Home / Self Care | Payer: Medicaid Other | Source: Ambulatory Visit | Attending: Nurse Practitioner | Admitting: Nurse Practitioner

## 2023-02-20 LAB — SURGICAL PATHOLOGY

## 2023-03-28 ENCOUNTER — Ambulatory Visit: Payer: Medicaid Other | Admitting: Internal Medicine

## 2023-04-05 ENCOUNTER — Encounter (HOSPITAL_COMMUNITY): Payer: Self-pay | Admitting: Emergency Medicine

## 2023-04-05 ENCOUNTER — Emergency Department (HOSPITAL_COMMUNITY)
Admission: EM | Admit: 2023-04-05 | Discharge: 2023-04-06 | Disposition: A | Discharge status: Home / Self Care | Payer: Medicaid Other | Attending: Emergency Medicine | Admitting: Emergency Medicine

## 2023-04-05 ENCOUNTER — Emergency Department (HOSPITAL_COMMUNITY): Payer: Medicaid Other

## 2023-04-05 ENCOUNTER — Other Ambulatory Visit: Payer: Self-pay

## 2023-04-05 DIAGNOSIS — M436 Torticollis: Secondary | ICD-10-CM | POA: Diagnosis not present

## 2023-04-05 DIAGNOSIS — G4486 Cervicogenic headache: Secondary | ICD-10-CM

## 2023-04-05 DIAGNOSIS — R519 Headache, unspecified: Secondary | ICD-10-CM | POA: Diagnosis not present

## 2023-04-05 DIAGNOSIS — Y92410 Unspecified street and highway as the place of occurrence of the external cause: Secondary | ICD-10-CM | POA: Diagnosis not present

## 2023-04-05 DIAGNOSIS — M542 Cervicalgia: Secondary | ICD-10-CM | POA: Diagnosis present

## 2023-04-05 DIAGNOSIS — M25531 Pain in right wrist: Secondary | ICD-10-CM | POA: Insufficient documentation

## 2023-04-05 DIAGNOSIS — S161XXA Strain of muscle, fascia and tendon at neck level, initial encounter: Secondary | ICD-10-CM

## 2023-04-05 MED ORDER — PROCHLORPERAZINE EDISYLATE 10 MG/2ML IJ SOLN
10.0000 mg | Freq: Once | INTRAMUSCULAR | Status: AC
  Administered 2023-04-06: 10 mg via INTRAMUSCULAR
  Filled 2023-04-05: qty 2

## 2023-04-05 MED ORDER — DIPHENHYDRAMINE HCL 50 MG/ML IJ SOLN
25.0000 mg | Freq: Once | INTRAMUSCULAR | Status: AC
  Administered 2023-04-06: 25 mg via INTRAMUSCULAR
  Filled 2023-04-05: qty 1

## 2023-04-05 MED ORDER — DEXAMETHASONE SODIUM PHOSPHATE 10 MG/ML IJ SOLN
10.0000 mg | Freq: Once | INTRAMUSCULAR | Status: AC
  Administered 2023-04-06: 10 mg via INTRAMUSCULAR
  Filled 2023-04-05: qty 1
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
## 2023-05-12 ENCOUNTER — Ambulatory Visit
Admission: EM | Admit: 2023-05-12 | Discharge: 2023-05-12 | Disposition: A | Discharge status: Home / Self Care | Payer: Medicaid Other | Attending: Family Medicine | Admitting: Family Medicine

## 2023-05-12 ENCOUNTER — Encounter: Payer: Self-pay | Admitting: Emergency Medicine

## 2023-05-12 DIAGNOSIS — R42 Dizziness and giddiness: Secondary | ICD-10-CM

## 2023-05-12 DIAGNOSIS — H6993 Unspecified Eustachian tube disorder, bilateral: Secondary | ICD-10-CM

## 2023-05-12 MED ORDER — MECLIZINE HCL 25 MG PO TABS: 25.0000 mg | ORAL_TABLET | Freq: Three times a day (TID) | ORAL | 0 refills | Status: DC | PRN

## 2023-05-12 MED ORDER — FLUTICASONE PROPIONATE 50 MCG/ACT NA SUSP: 1.0000 | Freq: Two times a day (BID) | NASAL | 2 refills | Status: AC

## 2023-05-12 MED ORDER — PSEUDOEPHEDRINE HCL 60 MG PO TABS: 60.0000 mg | ORAL_TABLET | Freq: Three times a day (TID) | ORAL | 0 refills | Status: AC | PRN

## 2023-05-12 NOTE — ED Provider Notes (Signed)
[facility]    CSN: [id number] Arrival date & time: 05/12/23  1057      History   Chief Complaint No chief complaint on file.   HPI [name] is a 52 y.o. female.   Patient presenting today with 1 week history of room spinning dizziness, bilateral ear pressure and fullness.  Denies nausea, vomiting, visual change, severe headache, chest pain, shortness of breath, palpitations, syncope.  States if she is laying still the dizziness goes away, if she moves her head or sits up the dizziness returns.  Not tried anything over-the-counter for symptoms.  Home COVID test was negative.    Past Medical History:  Diagnosis Date   Anxiety    Arthritis    Cancer (HCC)    Depression    Hypertension    Multiple gastric ulcers    Seizures (HCC)    had 1 seizure 9-10 yrs ago; unknown etiology and no meds. No more seizures    There are no problems to display for this patient.   Past Surgical History:  Procedure Laterality Date   BREAST BIOPSY Left 2019   BENIGN FIBROADENOMA   BREAST BIOPSY Right 01/24/2023   [place] RT BREAST BX W LOC DEV 1ST LESION IMG BX SPEC US GUIDE 01/24/2023 Edwin Cap, MD [facility]   CHOLECYSTECTOMY     EYE SURGERY Right    detatched retina   INCISIONAL HERNIA REPAIR N/A 09/08/2014   Procedure: Sherald Hess HERNIORRHAPHY WITH MESH;  Surgeon: Dalia Heading, MD;  Location: [facility];  Service: General;  Laterality: N/A;   INSERTION OF MESH N/A 09/08/2014   Procedure: INSERTION OF MESH;  Surgeon: Dalia Heading, MD;  Location: [facility];  Service: General;  Laterality: N/A;   KNEE SURGERY      OB History     Gravida  3   Para  2   Term  2   Preterm  0   AB  1   Living  2      SAB      IAB      Ectopic      Multiple      Live Births               Home Medications    Prior to Admission medications   Medication Sig Start Date End Date Taking? Authorizing Provider  fluticasone (FLONASE) 50 MCG/ACT nasal spray Place 1  spray into both nostrils 2 (two) times daily. 05/12/23  Yes Particia Nearing, PA-C  meclizine (ANTIVERT) 25 MG tablet Take 1 tablet (25 mg total) by mouth 3 (three) times daily as needed for dizziness. 05/12/23  Yes Particia Nearing, PA-C  pseudoephedrine (SUDAFED) 60 MG tablet Take 1 tablet (60 mg total) by mouth every 8 (eight) hours as needed for congestion. 05/12/23  Yes Particia Nearing, PA-C  atorvastatin (LIPITOR) 20 MG tablet Take 1 Tablet by mouth once daily for cholesterol 04/03/17   [provider]  clonazePAM (KLONOPIN) 1 MG tablet TAKE ONE TABLET BY MOUTH UP TO THREE TIMES DAILY AS NEEDED FOR ANXIETY 03/13/17   [provider]  diphenhydrAMINE (BENADRYL) 25 MG tablet Take 1 tablet (25 mg total) by mouth every 4 (four) hours as needed. For itching 04/10/17   Triplett, Tammy, PA-C  etonogestrel (IMPLANON) 68 MG IMPL implant Inject 1 each into the skin once.    [provider]  HYDROcodone-acetaminophen (NORCO/VICODIN) 5-325 MG tablet Take 1 tablet by mouth every 4 (four) hours as  needed. 02/25/18   Burgess Amor, PA-C  PARoxetine (PAXIL) 40 MG tablet Take 60 mg by mouth daily.    [provider]  risperiDONE (RISPERDAL) 1 MG tablet Take 1 tablet (1 mg total) by mouth daily. 03/31/19   Law, Waylan Boga, PA-C  traZODone (DESYREL) 100 MG tablet Take 300 mg by mouth at bedtime.    [provider]    Family History History reviewed. No pertinent family history.  Social History Social History   Tobacco Use   Smoking status: Never   Smokeless tobacco: Never  Vaping Use   Vaping status: Never Used  Substance Use Topics   Alcohol use: No   Drug use: No     Allergies   Naproxen and Ultram [tramadol hcl]   Review of Systems Review of Systems Per HPI  Physical Exam Triage Vital Signs ED Triage Vitals  Encounter Vitals Group     BP 05/12/23 1114 119/83     Systolic BP Percentile --      Diastolic BP Percentile --      Pulse  Rate 05/12/23 1114 72     Resp 05/12/23 1114 16     Temp 05/12/23 1114 98.4 F (36.9 C)     Temp Source 05/12/23 1114 Oral     SpO2 05/12/23 1114 93 %     Weight --      Height --      Head Circumference --      Peak Flow --      Pain Score 05/12/23 1115 0     Pain Loc --      Pain Education --      Exclude from Growth Chart --    Orthostatic VS for the past 24 hrs:  BP- Lying Pulse- Lying BP- Sitting Pulse- Sitting BP- Standing at 0 minutes Pulse- Standing at 0 minutes  05/12/23 1117 120/84 66 125/85 74 117/87 74    Updated Vital Signs BP 119/83 (BP Location: Right Arm)   Pulse 72   Temp 98.4 F (36.9 C) (Oral)   Resp 16   SpO2 93%   Visual Acuity Right Eye Distance:   Left Eye Distance:   Bilateral Distance:    Right Eye Near:   Left Eye Near:    Bilateral Near:     Physical Exam Vitals and nursing note reviewed.  Constitutional:      Appearance: Normal appearance. She is not ill-appearing.  HENT:     Head: Atraumatic.     Mouth/Throat:     Mouth: Mucous membranes are moist.  Eyes:     Extraocular Movements: Extraocular movements intact.     Conjunctiva/sclera: Conjunctivae normal.     Pupils: Pupils are equal, round, and reactive to light.  Cardiovascular:     Rate and Rhythm: Normal rate and regular rhythm.     Heart sounds: Normal heart sounds.  Pulmonary:     Effort: Pulmonary effort is normal.     Breath sounds: Normal breath sounds.  Musculoskeletal:        General: Normal range of motion.     Cervical back: Normal range of motion and neck supple.  Skin:    General: Skin is warm and dry.  Neurological:     Mental Status: She is alert and oriented to person, place, and time.     Cranial Nerves: No cranial nerve deficit.     Motor: No weakness.     Gait: Gait normal.  Psychiatric:  Mood and Affect: Mood normal.        Thought Content: Thought content normal.        Judgment: Judgment normal.    UC Treatments / Results  Labs (all  labs ordered are listed, but only abnormal results are displayed) Labs Reviewed - No data to display  EKG   Radiology No results found.  Procedures Procedures (including critical care time)  Medications Ordered in UC Medications - No data to display  Initial Impression / Assessment and Plan / UC Course  I have reviewed the triage vital signs and the nursing notes.  Pertinent labs & imaging results that were available during my care of the patient were reviewed by me and considered in my medical decision making (see chart for details).     Vitals and exam reassuring today, no focal neurologic deficits noted and no red flag findings.  Suspect middle ear effusion causing some vertigo symptoms.  Treat with Flonase and Sudafed, meclizine, Epley maneuvers, close PCP follow-up.  Return for worsening symptoms.  Final Clinical Impressions(s) / UC Diagnoses   Final diagnoses:  Acute dysfunction of Eustachian tube, bilateral  Vertigo   Discharge Instructions   None    ED Prescriptions     Medication Sig Dispense Auth. Provider   meclizine (ANTIVERT) 25 MG tablet Take 1 tablet (25 mg total) by mouth 3 (three) times daily as needed for dizziness. 30 tablet Particia Nearing, PA-C   fluticasone [facility]) 50 MCG/ACT nasal spray Place 1 spray into both nostrils 2 (two) times daily. 16 g Particia Nearing, [place]   pseudoephedrine (SUDAFED) 60 MG tablet Take 1 tablet (60 mg total) by mouth every 8 (eight) hours as needed for congestion. 20 tablet Particia Nearing, [place]      PDMP not reviewed this encounter.   Particia Nearing, [place] 05/12/23 1158

## 2023-05-12 NOTE — ED Triage Notes (Signed)
Dizziness x 1 week with headache and bilateral ears feel full.   States headache is gone now.  States took a home covid test yesterday that was negative.

## 2023-06-12 NOTE — Patient Instructions (Signed)
Thyroid Needle Biopsy  The thyroid is a gland in the front of the neck. It makes hormones that help control how the body works. This includes how the body grows and develops, stays at a healthy temperature, and uses food for energy (metabolism). Thyroid needle biopsy is a procedure to remove small samples of tissue or fluid from the thyroid. The samples are then looked at under a microscope. This may be done to check for cancer, infection, or other problems with the thyroid. Tell a health care provider about: Any allergies you have. All medicines you are taking, including vitamins, herbs, eye drops, creams, and over-the-counter medicines. Any problems you or family members have had with anesthesia. Any bleeding problems you have. Any surgeries you have had. Any medical conditions you have. Whether you are pregnant or may be pregnant. What are the risks? Your health care provider will talk with you about risks. These may include: Infection. Bleeding. Allergic reactions to medicines. Damage to nerves or blood vessels in the neck. What happens during the procedure? You will be asked to lie on your back with your head tipped backward. This helps you extend your neck. You may be asked to try not to cough, talk, swallow, or make sounds during some parts of the procedure. The skin of your neck will be washed with a soap that kills germs. Medicine may be put into the skin over your thyroid to numb the area. An ultrasound may be done to help guide the needle to where it needs to go. A thin needle (fine needle) will be inserted through the skin and into your thyroid. It will be used to remove tissue or fluid samples. The needle will be removed. The samples will be sent to a lab. Pressure may be applied to your neck to reduce swelling and stop bleeding. The procedure may vary among providers and hospitals. What happens after the procedure? It is up to you to get the results of your procedure. Ask  your provider, or the department that is doing the procedure, when your results will be ready. This information is not intended to replace advice given to you by your health care provider. Make sure you discuss any questions you have with your health care provider. Document Revised: 04/21/2022 Document Reviewed: 04/21/2022 Elsevier Patient Education  2024 [employer].

## 2023-06-13 ENCOUNTER — Encounter: Payer: Self-pay | Admitting: Nurse Practitioner

## 2023-06-13 ENCOUNTER — Ambulatory Visit (INDEPENDENT_AMBULATORY_CARE_PROVIDER_SITE_OTHER): Payer: Medicaid Other | Admitting: Nurse Practitioner

## 2023-06-13 VITALS — BP 135/80 | HR 79 | Ht 66.0 in | Wt 281.4 lb

## 2023-06-13 DIAGNOSIS — E042 Nontoxic multinodular goiter: Secondary | ICD-10-CM | POA: Diagnosis not present

## 2023-06-13 DIAGNOSIS — Z8349 Family history of other endocrine, nutritional and metabolic diseases: Secondary | ICD-10-CM

## 2023-06-13 NOTE — Progress Notes (Signed)
Endocrinology Consult Note 06/13/23    ---------------------------------------------------------------------------------------------------------------------- Subjective    Past Medical History:  Diagnosis Date   Anxiety    Arthritis    Cancer (HCC)    Depression    Hypertension    Multiple gastric ulcers    Seizures (HCC)    had 1 seizure 9-10 yrs ago; unknown etiology and no meds. No more seizures    Past Surgical History:  Procedure Laterality Date   BREAST BIOPSY Left 2019   BENIGN FIBROADENOMA   BREAST BIOPSY Right 01/24/2023   [place] RT BREAST BX W LOC DEV 1ST LESION IMG BX SPEC US GUIDE 01/24/2023 Edwin Cap, MD [facility]   CHOLECYSTECTOMY     EYE SURGERY Right    detatched retina   INCISIONAL HERNIA REPAIR N/A 09/08/2014   Procedure: Sherald Hess HERNIORRHAPHY WITH MESH;  Surgeon: Dalia Heading, MD;  Location: [facility];  Service: General;  Laterality: N/A;   INSERTION OF MESH N/A 09/08/2014   Procedure: INSERTION OF MESH;  Surgeon: Dalia Heading, MD;  Location: [facility];  Service: General;  Laterality: N/A;   KNEE SURGERY      Social History   Socioeconomic History   Marital status: Married    Spouse name: Not on file   Number of children: Not on file   Years of education: Not on file   Highest education level: Not on file  Occupational History   Not on file  Tobacco Use   Smoking status: Never   Smokeless tobacco: Never  Vaping Use   Vaping status: Never Used  Substance and Sexual Activity   Alcohol use: No   Drug use: No   Sexual activity: Yes    Birth control/protection: Implant    Comment: implanon  Other Topics Concern   Not on file  Social History Narrative   Not on file   Social Determinants of Health   Financial Resource Strain: Low Risk  (07/29/2022)   Overall Financial Resource Strain (CARDIA)    Difficulty of Paying Living Expenses: Not very hard  Food Insecurity: Food Insecurity Present (07/29/2022)   Hunger Vital Sign    Worried  About Running Out of Food in the Last Year: Sometimes true    Ran Out of Food in the Last Year: Sometimes true  Transportation Needs: No Transportation Needs (07/29/2022)   PRAPARE - Administrator, Civil Service (Medical): No    Lack of Transportation (Non-Medical): No  Physical Activity: Inactive (07/29/2022)   Exercise Vital Sign    Days of Exercise per Week: 0 days    Minutes of Exercise per Session: 0 min  Stress: Stress Concern Present (07/29/2022)   [employer] of Occupational Health - Occupational Stress Questionnaire    Feeling of Stress : To some extent  Social Connections: Socially Integrated (07/29/2022)   Social Connection and Isolation Panel [NHANES]    Frequency of Communication with Friends and Family: More than three times a week    Frequency of Social Gatherings with Friends and Family: More than three times a week    Attends Religious Services: More than 4 times per year    Active Member of [employer] or Organizations: Yes    Attends Engineer, structural: More than 4 times per year    Marital Status: Married  Catering manager Violence: Not At Risk (07/29/2022)   Humiliation, Afraid, Rape, and Kick questionnaire    Fear of Current or Ex-Partner: No    Emotionally Abused: No  Physically Abused: No    Sexually Abused: No    Current Outpatient Medications on File Prior to Visit  Medication Sig Dispense Refill   albuterol (VENTOLIN HFA) 108 (90 Base) MCG/ACT inhaler Inhale 1 puff into the lungs every 4 (four) hours as needed.     atorvastatin (LIPITOR) 20 MG tablet Take 1 Tablet by mouth once daily for cholesterol  8   clonazePAM (KLONOPIN) 1 MG tablet TAKE ONE TABLET BY MOUTH UP TO THREE TIMES DAILY AS NEEDED FOR ANXIETY  0   diphenhydrAMINE (BENADRYL) 25 MG tablet Take 1 tablet (25 mg total) by mouth every 4 (four) hours as needed. For itching 20 tablet 0   etonogestrel (IMPLANON) 68 MG IMPL implant Inject 1 each into the skin once.      fluticasone (FLONASE) 50 MCG/ACT nasal spray Place 1 spray into both nostrils 2 (two) times daily. 16 g 2   HYDROcodone-acetaminophen (NORCO) 10-325 MG tablet Take 1 tablet by mouth every 6 (six) hours as needed.     meclizine (ANTIVERT) 25 MG tablet Take 1 tablet (25 mg total) by mouth 3 (three) times daily as needed for dizziness. 30 tablet 0   PARoxetine (PAXIL) 40 MG tablet Take 60 mg by mouth daily.     pseudoephedrine (SUDAFED) 60 MG tablet Take 1 tablet (60 mg total) by mouth every 8 (eight) hours as needed for congestion. 20 tablet 0   risperiDONE (RISPERDAL) 1 MG tablet Take 1 tablet (1 mg total) by mouth daily. 30 tablet 0   traZODone (DESYREL) 100 MG tablet Take 300 mg by mouth at bedtime.     HYDROcodone-acetaminophen (NORCO/VICODIN) 5-325 MG tablet Take 1 tablet by mouth every 4 (four) hours as needed. (Patient not taking: Reported on 06/13/2023) 10 tablet 0   No current facility-administered medications on file prior to visit.      HPI   [name] is a 52 y.o.-year-old female, referred by her PCP, for evaluation for multinodular goiter.  This was an incidental finding during recent exam.  A mass was noted to right neck prompting further evaluation with thyroid ultrasound.  Thyroid U/S: 01/19/23 CLINICAL DATA:  Neck Mass   EXAM: THYROID ULTRASOUND   TECHNIQUE: Ultrasound examination of the thyroid gland and adjacent soft tissues was performed.   COMPARISON:  None available   FINDINGS: Parenchymal Echotexture: Markedly heterogeneous   Isthmus: 0.4 cm   Right lobe: 6.9 x 2.7 x 3.4 cm   Left lobe: 7.1 x 4.5 x 3.0 cm   _________________________________________________________   Estimated total number of nodules >/= 1 cm: 6   Number of spongiform nodules >/=  2 cm not described below (TR1): 0   Number of mixed cystic and solid nodules >/= 1.5 cm not described below (TR2): 0   _________________________________________________________   Nodule # 1:    Location: Right; superior   Maximum size: 2.1 cm; Other 2 dimensions: 1.8 x 2.0 cm   Composition: solid/almost completely solid (2)   Echogenicity: isoechoic (1)   Shape: not taller-than-wide (0)   Margins: smooth (0)   Echogenic foci: none (0)   ACR TI-RADS total points: 3.   ACR TI-RADS risk category: TR3 (3 points).   ACR TI-RADS recommendations:   *Given size (>/= 1.5 - 2.4 cm) and appearance, a follow-up ultrasound in 1 year should be considered based on TI-RADS criteria.   _________________________________________________________   Nodule 2: 1.2 cm predominantly cystic right mid thyroid nodule does not meet criteria for imaging surveillance or  FNA.   _________________________________________________________   Nodule # 3:   Location: Right; mid   Maximum size: 2.4 cm; Other 2 dimensions: 1.5 x 2.4 cm   Composition: solid/almost completely solid (2)   Echogenicity: isoechoic (1)   Shape: not taller-than-wide (0)   Margins: smooth (0)   Echogenic foci: none (0)   ACR TI-RADS total points: 3.   ACR TI-RADS risk category: TR3 (3 points).   ACR TI-RADS recommendations:   *Given size (>/= 1.5 - 2.4 cm) and appearance, a follow-up ultrasound in 1 year should be considered based on TI-RADS criteria.   _________________________________________________________   Nodule # 4:   Location: Right; inferior   Maximum size: 3.1 cm; Other 2 dimensions: 2.5 x 2.3 cm   Composition: solid/almost completely solid (2)   Echogenicity: hypoechoic (2)   Shape: not taller-than-wide (0)   Margins: ill-defined (0)   Echogenic foci: none (0)   ACR TI-RADS total points: 4.   ACR TI-RADS risk category: TR4 (4-6 points).   ACR TI-RADS recommendations:   **Given size (>/= 1.5 cm) and appearance, fine needle aspiration of this moderately suspicious nodule should be considered based on TI-RADS criteria.    _________________________________________________________   Nodule 5: 2.7 x 2.4 x 2.2 cm heterogeneous region in the superior left thyroid lobe favored to be pseudo nodule given lack of define margins.   _________________________________________________________   Nodule # 6:   Location: Left; inferior   Maximum size: 1.3 cm; Other 2 dimensions: 1.2 x 1.2 cm   Composition: solid/almost completely solid (2)   Echogenicity: hypoechoic (2)   Shape: not taller-than-wide (0)   Margins: smooth (0)   Echogenic foci: none (0)   ACR TI-RADS total points: 4.   ACR TI-RADS risk category: TR4 (4-6 points).   ACR TI-RADS recommendations:   *Given size (>/= 1 - 1.4 cm) and appearance, a follow-up ultrasound in 1 year should be considered based on TI-RADS criteria.   _________________________________________________________   IMPRESSION: 1. Multinodular goiter. 2. Nodules 4 meets criteria for FNA. 3. Nodules 1, 3, and 6 meet criteria for imaging follow-up. Annual ultrasound surveillance is recommended until 5 years of stability is documented.   The above is in keeping with the ACR TI-RADS recommendations - J Am Coll Radiol 2017;14:587-595.     Electronically Signed   By: Acquanetta Belling M.D.   On: 01/20/2023 14:08   I reviewed pt's thyroid tests: Lab Results  Component Value Date   TSH 1.34 07/26/2022   TSH 1.01 04/25/2022     Pt c/o: - temperature fluctuations - tremors (since childhood) - anxiety/depression - weight gain - headaches - dizziness - sore throat - choking sensation  She does have family history of thyroid problems, sister was recently diagnosed with multiple thyroid nodules, mom has ?hypothyroidism.  No FH of thyroid cancer. No h/o radiation tx to head or neck.  No seaweed or kelp. No recent contrast studies. No steroid use. No herbal supplements. No Biotin supplements or Hair, Skin and Nails vitamins.  Pt also has a history of Bipolar disorder,  anxiety.  Review of systems  Constitutional: + increasing body weight,  current Body mass index is 45.42 kg/m. , no fatigue, + temperature fluctuations Eyes: no blurry vision, no xerophthalmia ENT: + intermittent sore throat, + nodules palpated in throat, + intermittent dysphagia/odynophagia, no hoarseness Cardiovascular: no chest pain, no shortness of breath, no palpitations, no leg swelling Respiratory: + intermittent coughing spells, no shortness of breath Gastrointestinal: no nausea/vomiting/diarrhea Musculoskeletal: no  muscle/joint aches Skin: no rashes, no hyperemia Neurological: + tremors (since childhood), no numbness, no tingling, no dizziness Psychiatric: no depression, + anxiety  ---------------------------------------------------------------------------------------------------------------------- Objective    BP 135/80 (BP Location: Left Arm, Patient Position: Sitting, Cuff Size: Large)   Pulse 79   Ht 5\' 6"  (1.676 m)   Wt 281 lb 6.4 oz (127.6 kg)   BMI 45.42 kg/m    BP Readings from Last 3 Encounters:  06/13/23 135/80  05/12/23 119/83  04/06/23 126/80    Wt Readings from Last 3 Encounters:  06/13/23 281 lb 6.4 oz (127.6 kg)  04/05/23 276 lb (125.2 kg)  07/29/22 267 lb 2 oz (121.2 kg)     Physical Exam- Limited  Constitutional:  Body mass index is 45.42 kg/m. , not in acute distress, mildly anxious state of mind Eyes:  EOMI, no exophthalmos Neck: Supple Thyroid: + gross goiter with palpable nodule in right lower thyroid gland Cardiovascular: RRR, no murmurs, rubs, or gallops, no edema Respiratory: Adequate breathing efforts, no crackles, rales, rhonchi, or wheezing Musculoskeletal: no gross deformities, strength intact in all four extremities, no gross restriction of joint movements Skin:  no rashes, no hyperemia Neurological: + tremor with outstretched  hands     ----------------------------------------------------------------------------------------------------------------------  ASSESSMENT / PLAN:  1. Thyroid Nodule  - I reviewed the images of her thyroid ultrasound along with the patient. I pointed out that the dominant nodules are large, this being a risk factor for cancer.  Otherwise, the nodules are: - not hypoechoic - without microcalcifications - without internal blood flow - more wide than tall - well delimited from surrounding tissue  Pt does not have a thyroid cancer family history or a personal history of RxTx to head/neck. All these would favor benignity.  - the only way that we can tell exactly if it is cancer or not is by doing a thyroid biopsy (FNA). I explained what the test entails.  - patient decided to have the FNA done now >> I ordered this.   - I explained that this is not cancer, we can continue to follow her on a yearly basis, and check another ultrasound in another year or 2. - she should let me know if she develops neck compression symptoms, in that case, we might need to do either lobectomy or thyroidectomy  - I did explain that, while thyroid surgery is not a complicated one, it still can have side effects and also she might have a risk of ~25% of becoming hypothyroid after hemithyroidectomy.   I will also check more comprehensive thyroid panel to assess for functional problem with the thyroid given moms history of hypothyroidism.  Will call patient with results and plan moving forward.   Follow Up Plan: Return will call with thyroid lab results and biopsy results, for Thyroid follow up, Previsit labs, thyroid ultrasound.    I spent 45 minutes in the care of the patient today including review of labs from Thyroid Function, CMP, and other relevant labs ; imaging/biopsy records (current and previous including abstractions from other facilities); face-to-face time discussing  her lab results and symptoms,  medications doses, her options of short and long term treatment based on the latest standards of care / guidelines;   and documenting the encounter.  Laneta Simmers  participated in the discussions, expressed understanding, and voiced agreement with the above plans.  All questions were answered to her satisfaction. she is encouraged to contact clinic should she have any questions or concerns prior to  her return visit.    Ronny Bacon, [facility] [address] [place], [place] [address] Phone: [phone] Fax: [phone]

## 2023-06-14 ENCOUNTER — Encounter: Payer: Self-pay | Admitting: Nurse Practitioner

## 2023-06-14 LAB — THYROID PEROXIDASE ANTIBODY: Thyroperoxidase Ab SerPl-aCnc: 11 [IU]/mL (ref 0–34)

## 2023-06-14 LAB — TSH: TSH: 0.804 u[IU]/mL (ref 0.450–4.500)

## 2023-06-14 LAB — THYROGLOBULIN ANTIBODY: Thyroglobulin Antibody: 389.3 [IU]/mL — ABNORMAL HIGH (ref 0.0–0.9)

## 2023-06-14 LAB — T4, FREE: Free T4: 1.18 ng/dL (ref 0.82–1.77)

## 2023-06-14 LAB — T3, FREE: T3, Free: 3.4 pg/mL (ref 2.0–4.4)

## 2023-06-14 NOTE — Progress Notes (Signed)
FYI I sent patient mychart message going over recent labs.

## 2023-06-14 NOTE — Progress Notes (Signed)
Noted  

## 2023-06-14 NOTE — Telephone Encounter (Signed)
-----   Message from [name] sent at 06/14/2023  6:59 AM EDT ----- [name] I sent patient mychart message going over recent labs.

## 2023-06-16 ENCOUNTER — Ambulatory Visit (HOSPITAL_COMMUNITY)
Admission: RE | Admit: 2023-06-16 | Discharge: 2023-06-16 | Disposition: A | Discharge status: Home / Self Care | Payer: Medicaid Other | Source: Ambulatory Visit | Attending: Nurse Practitioner | Admitting: Nurse Practitioner

## 2023-06-16 ENCOUNTER — Encounter (HOSPITAL_COMMUNITY): Payer: Self-pay

## 2023-06-16 DIAGNOSIS — E041 Nontoxic single thyroid nodule: Secondary | ICD-10-CM | POA: Diagnosis present

## 2023-06-16 MED ORDER — LIDOCAINE HCL (PF) 2 % IJ SOLN
INTRAMUSCULAR | Status: AC
  Filled 2023-06-16: qty 10

## 2023-06-16 MED ORDER — LIDOCAINE HCL (PF) 2 % IJ SOLN
10.0000 mL | Freq: Once | INTRAMUSCULAR | Status: AC
  Administered 2023-06-16: 10 mL

## 2023-06-16 NOTE — Procedures (Signed)
Interventional Radiology Procedure Note  Procedure:  US guided FNA of right inferior thyroid nodule  Complications: None Recommendations:  - Ok to shower tomorrow - ice and OTC's prn - Do not submerge for 7 days - Routine care   Signed,  Yvone Neu. Loreta Ave, DO

## 2023-06-16 NOTE — Progress Notes (Signed)
PT tolerated thyroid biopsy procedure well today. Labs and afirma obtained and sent for pathology. PT ambulatory at discharge with no acute distress noted and verbalized understanding of discharge instructions. Labs taken to lab for processing by [facility] from ultrasound at 0928.

## 2023-06-19 ENCOUNTER — Encounter: Payer: Self-pay | Admitting: Nurse Practitioner

## 2023-06-19 LAB — CYTOLOGY - NON PAP

## 2023-06-19 NOTE — Progress Notes (Signed)
FYI: I sent patient mychart msg going over preliminary biopsy results

## 2023-06-20 ENCOUNTER — Telehealth: Payer: Self-pay | Admitting: *Deleted

## 2023-06-20 NOTE — Telephone Encounter (Signed)
Noted  

## 2023-06-20 NOTE — Telephone Encounter (Signed)
-----   Message from [name] sent at 06/19/2023 10:58 AM EDT ----- Lorain Childes: I sent patient mychart msg going over preliminary biopsy results

## 2023-06-29 ENCOUNTER — Telehealth: Payer: Self-pay | Admitting: *Deleted

## 2023-06-29 NOTE — Telephone Encounter (Signed)
Patient called and shared that she was having trouble with her MyChart. She would like to hear from [facility] about her Lab and Throat Biopsy.

## 2023-06-30 NOTE — Telephone Encounter (Signed)
I called patient and went over results with her over the phone.  I informed her that Afirma has not come back yet but when it does I will call and go over with her and what that means for next steps.

## 2023-06-30 NOTE — Telephone Encounter (Signed)
Noted  

## 2023-07-03 ENCOUNTER — Encounter (HOSPITAL_COMMUNITY): Payer: Self-pay

## 2023-07-03 ENCOUNTER — Telehealth: Payer: Self-pay | Admitting: *Deleted

## 2023-07-03 ENCOUNTER — Other Ambulatory Visit: Payer: Self-pay | Admitting: Nurse Practitioner

## 2023-07-03 DIAGNOSIS — E042 Nontoxic multinodular goiter: Secondary | ICD-10-CM

## 2023-07-03 DIAGNOSIS — R768 Other specified abnormal immunological findings in serum: Secondary | ICD-10-CM

## 2023-07-03 NOTE — Telephone Encounter (Signed)
-----   Message from [name] sent at 07/03/2023  8:39 AM EDT ----- Please call the patient and let her know the Afirma results came in showing the nodule was benign, non-cancerous. I still recommend periodic surveillance of her labs given her positive antibodies.  Will plan to recheck labs in 4 months and schedule follow up at that time.  I will enter the orders.

## 2023-07-03 NOTE — Progress Notes (Signed)
Patient was called and made aware. 

## 2023-07-03 NOTE — Telephone Encounter (Signed)
Patient was called and a message was left asking that she call our office back.

## 2023-07-03 NOTE — Telephone Encounter (Signed)
Thyroid antibodies were positive, meaning she is more at risk for developing a problem with the thyroid functionally, but her recent labs show her thyroid is making the appropriate amount for the time being.  It is possible her thyroid is still swollen some from the biopsy which will just take some time to reduce on its own.  I do recommend she follow up with PCP regarding other possibilities for that as well.

## 2023-07-03 NOTE — Progress Notes (Signed)
Patient was called and given her results. 

## 2023-07-03 NOTE — Telephone Encounter (Signed)
Talked with the patient and gave her the results.  She shares that she thought that [name] was going to call in some tyroid medication , given she has the positive antibodies for her thyroid. She also has a sore throat, but has appointment with her PCP on October 3rd. She is asking if this could be related to her thyroid?

## 2023-07-03 NOTE — Telephone Encounter (Signed)
Patient was called and given this information.

## 2023-07-03 NOTE — Progress Notes (Signed)
Please call the patient and let her know the Afirma results came in showing the nodule was benign, non-cancerous. I still recommend periodic surveillance of her labs given her positive antibodies.  Will plan to recheck labs in 4 months and schedule follow up at that time.  I will enter the orders.

## 2023-07-06 ENCOUNTER — Ambulatory Visit: Payer: Medicaid Other | Admitting: Internal Medicine

## 2023-07-06 ENCOUNTER — Encounter: Payer: Self-pay | Admitting: Internal Medicine

## 2023-07-06 VITALS — BP 112/78 | HR 69 | Resp 16 | Ht 66.0 in | Wt 283.6 lb

## 2023-07-06 DIAGNOSIS — B351 Tinea unguium: Secondary | ICD-10-CM | POA: Insufficient documentation

## 2023-07-06 DIAGNOSIS — Z23 Encounter for immunization: Secondary | ICD-10-CM

## 2023-07-06 DIAGNOSIS — Z114 Encounter for screening for human immunodeficiency virus [HIV]: Secondary | ICD-10-CM

## 2023-07-06 DIAGNOSIS — Z1159 Encounter for screening for other viral diseases: Secondary | ICD-10-CM

## 2023-07-06 DIAGNOSIS — F319 Bipolar disorder, unspecified: Secondary | ICD-10-CM | POA: Insufficient documentation

## 2023-07-06 DIAGNOSIS — M7918 Myalgia, other site: Secondary | ICD-10-CM

## 2023-07-06 DIAGNOSIS — F5104 Psychophysiologic insomnia: Secondary | ICD-10-CM

## 2023-07-06 DIAGNOSIS — K259 Gastric ulcer, unspecified as acute or chronic, without hemorrhage or perforation: Secondary | ICD-10-CM

## 2023-07-06 DIAGNOSIS — K219 Gastro-esophageal reflux disease without esophagitis: Secondary | ICD-10-CM

## 2023-07-06 DIAGNOSIS — E042 Nontoxic multinodular goiter: Secondary | ICD-10-CM | POA: Diagnosis not present

## 2023-07-06 DIAGNOSIS — F411 Generalized anxiety disorder: Secondary | ICD-10-CM | POA: Insufficient documentation

## 2023-07-06 DIAGNOSIS — J449 Chronic obstructive pulmonary disease, unspecified: Secondary | ICD-10-CM | POA: Diagnosis not present

## 2023-07-06 DIAGNOSIS — G4700 Insomnia, unspecified: Secondary | ICD-10-CM | POA: Insufficient documentation

## 2023-07-06 DIAGNOSIS — G8929 Other chronic pain: Secondary | ICD-10-CM | POA: Insufficient documentation

## 2023-07-06 DIAGNOSIS — I10 Essential (primary) hypertension: Secondary | ICD-10-CM

## 2023-07-06 DIAGNOSIS — E785 Hyperlipidemia, unspecified: Secondary | ICD-10-CM

## 2023-07-06 MED ORDER — TERBINAFINE HCL 250 MG PO TABS
250.0000 mg | ORAL_TABLET | Freq: Every day | ORAL | 0 refills | Status: AC
Start: 2023-07-06 — End: 2023-09-28

## 2023-07-06 NOTE — Assessment & Plan Note (Signed)
History of GERD and multiple gastric ulcers.  She is currently prescribed Pepcid 20 mg daily.  No medication changes have been made today.

## 2023-07-06 NOTE — Assessment & Plan Note (Signed)
Influenza vaccine administered today.

## 2023-07-06 NOTE — Assessment & Plan Note (Signed)
She was recently referred to endocrinology in setting of multinodular goiter.  Underwent ultrasound-guided FNA of a right inferior thyroid nodule.  Pathology benign.  Endocrinology follow-up is scheduled for January.

## 2023-07-06 NOTE — Assessment & Plan Note (Signed)
Terbinafine 250 mg daily x 12 weeks prescribed today

## 2023-07-06 NOTE — Assessment & Plan Note (Signed)
Followed by [name], where she receives periodic corticosteroid injections in the setting of diffuse osteoarthritis.  She reports receiving injections in both knees, her left hip, and her back.  She has also been prescribed hydrocodone-acetaminophen 10-325 mg twice daily as needed for pain relief.  This has been managed by her previous PCP.  PDMP reviewed and is appropriate. -No medication changes have been made today.  Patient will sign a controlled substance agreement and complete UDS.  No refills will be provided until these items are completed.

## 2023-07-06 NOTE — Assessment & Plan Note (Signed)
She endorses a history of COPD.  No PFTs on file for review.  She is prescribed albuterol for as needed use, which she reports using roughly once per week.  She is currently asymptomatic.  Pulmonary exam is unremarkable.  No medication changes are indicated today.

## 2023-07-06 NOTE — Assessment & Plan Note (Signed)
No recent lipid panel available for review.  She is currently prescribed atorvastatin 20 mg daily.  Repeat lipid panel ordered today.

## 2023-07-06 NOTE — Assessment & Plan Note (Signed)
She is currently prescribed trazodone 300 mg nightly -As otherwise documented, she has been referred to psychiatry.  No medication changes have been made today.

## 2023-07-06 NOTE — Progress Notes (Signed)
New Patient Office Visit  Subjective    Patient ID: [name], female    DOB: 1970-12-31  Age: 52 y.o. MRN: [id number]  CC:  Chief Complaint  Patient presents with   Establish Care   Irritable Bowel Syndrome    Has been getting worse    Nail Problem    Has a fungus on her toe left toes    Hot Flashes    Has been having a lot of hot flashes     HPI [name] presents to establish care.  She is a 52 year old woman who endorses a past medical history significant for COPD, chronic musculoskeletal pain, multinodular goiter, generalized anxiety disorder, bipolar disorder, insomnia, hyperlipidemia, and GERD.  Previously followed by Gilman Schmidt, NP.  Mr. Hribar has multiple concerns to discuss today.  She states that her IBS symptoms have been worse recently and that she often has to stop on the way home after eating out at a restaurant to use the bathroom.  She additionally endorses persistent toenail fungus on her left foot that she would like for me to evaluate.  Lastly, she endorses worsening hot flashes, noting that she has to sleep under a fan and without sheets at night.  She is currently unemployed and denies tobacco, alcohol, and illicit drug use.  Her family medical history is significant for multiple cancers, including lung, breast, cervical, and thyroid cancer.  Acute concerns, chronic medical conditions, and outstanding preventative care items discussed today are individually addressed in A/P below.  Outpatient Encounter Medications as of 07/06/2023  Medication Sig   albuterol (VENTOLIN HFA) 108 (90 Base) MCG/ACT inhaler Inhale 1 puff into the lungs every 4 (four) hours as needed.   atorvastatin (LIPITOR) 20 MG tablet Take 1 Tablet by mouth once daily for cholesterol   clonazePAM (KLONOPIN) 1 MG tablet TAKE ONE TABLET BY MOUTH UP TO THREE TIMES DAILY AS NEEDED FOR ANXIETY   diphenhydrAMINE (BENADRYL) 25 MG tablet Take 1 tablet (25 mg total) by mouth every 4 (four)  hours as needed. For itching   etonogestrel (IMPLANON) 68 MG IMPL implant Inject 1 each into the skin once.   fluticasone (FLONASE) 50 MCG/ACT nasal spray Place 1 spray into both nostrils 2 (two) times daily.   HYDROcodone-acetaminophen (NORCO) 10-325 MG tablet Take 1 tablet by mouth every 6 (six) hours as needed.   meclizine (ANTIVERT) 25 MG tablet Take 1 tablet (25 mg total) by mouth 3 (three) times daily as needed for dizziness.   PARoxetine (PAXIL) 40 MG tablet Take 60 mg by mouth daily.   pseudoephedrine (SUDAFED) 60 MG tablet Take 1 tablet (60 mg total) by mouth every 8 (eight) hours as needed for congestion.   risperiDONE (RISPERDAL) 1 MG tablet Take 1 tablet (1 mg total) by mouth daily.   terbinafine (LAMISIL) 250 MG tablet Take 1 tablet (250 mg total) by mouth daily.   traZODone (DESYREL) 100 MG tablet Take 300 mg by mouth at bedtime.   [DISCONTINUED] HYDROcodone-acetaminophen (NORCO/VICODIN) 5-325 MG tablet Take 1 tablet by mouth every 4 (four) hours as needed. (Patient not taking: Reported on 06/13/2023)   No facility-administered encounter medications on file as of 07/06/2023.    Past Medical History:  Diagnosis Date   Anxiety    Arthritis    Cancer (HCC)    Depression    Hypertension    Multiple gastric ulcers    Seizures (HCC)    had 1 seizure 9-10 yrs ago; unknown etiology and no  meds. No more seizures    Past Surgical History:  Procedure Laterality Date   BREAST BIOPSY Left 2019   BENIGN FIBROADENOMA   BREAST BIOPSY Right 01/24/2023   [place] RT BREAST BX W LOC DEV 1ST LESION IMG BX SPEC US GUIDE 01/24/2023 Edwin Cap, MD [facility]   CHOLECYSTECTOMY     EYE SURGERY Right    detatched retina   INCISIONAL HERNIA REPAIR N/A 09/08/2014   Procedure: Sherald Hess HERNIORRHAPHY WITH MESH;  Surgeon: Dalia Heading, MD;  Location: [facility];  Service: General;  Laterality: N/A;   INSERTION OF MESH N/A 09/08/2014   Procedure: INSERTION OF MESH;  Surgeon: Dalia Heading, MD;   Location: [facility];  Service: General;  Laterality: N/A;   KNEE SURGERY      History reviewed. No pertinent family history.  Social History   Socioeconomic History   Marital status: Married    Spouse name: Not on file   Number of children: Not on file   Years of education: Not on file   Highest education level: Not on file  Occupational History   Not on file  Tobacco Use   Smoking status: Never   Smokeless tobacco: Never  Vaping Use   Vaping status: Never Used  Substance and Sexual Activity   Alcohol use: No   Drug use: No   Sexual activity: Yes    Birth control/protection: Implant    Comment: implanon  Other Topics Concern   Not on file  Social History Narrative   Not on file   Social Determinants of Health   Financial Resource Strain: Low Risk  (07/29/2022)   Overall Financial Resource Strain (CARDIA)    Difficulty of Paying Living Expenses: Not very hard  Food Insecurity: Food Insecurity Present (07/29/2022)   Hunger Vital Sign    Worried About Running Out of Food in the Last Year: Sometimes true    Ran Out of Food in the Last Year: Sometimes true  Transportation Needs: No Transportation Needs (07/29/2022)   PRAPARE - Administrator, Civil Service (Medical): No    Lack of Transportation (Non-Medical): No  Physical Activity: Inactive (07/29/2022)   Exercise Vital Sign    Days of Exercise per Week: 0 days    Minutes of Exercise per Session: 0 min  Stress: Stress Concern Present (07/29/2022)   [employer] of Occupational Health - Occupational Stress Questionnaire    Feeling of Stress : To some extent  Social Connections: Socially Integrated (07/29/2022)   Social Connection and Isolation Panel [NHANES]    Frequency of Communication with Friends and Family: More than three times a week    Frequency of Social Gatherings with Friends and Family: More than three times a week    Attends Religious Services: More than 4 times per year    Active Member  of [employer] or Organizations: Yes    Attends Engineer, structural: More than 4 times per year    Marital Status: Married  Catering manager Violence: Not At Risk (07/29/2022)   Humiliation, Afraid, Rape, and Kick questionnaire    Fear of Current or Ex-Partner: No    Emotionally Abused: No    Physically Abused: No    Sexually Abused: No   Review of Systems  Constitutional:  Positive for diaphoresis (Hot flashes).  Gastrointestinal:  Positive for constipation and diarrhea.  Musculoskeletal:  Positive for back pain and joint pain.  Skin:        Toenail fungus on left foot  All other systems reviewed and are negative.  Objective    BP 112/78   Pulse 69   Resp 16   Ht 5\' 6"  (1.676 m)   Wt 283 lb 9.6 oz (128.6 kg)   SpO2 95%   BMI 45.77 kg/m   Physical Exam Vitals reviewed.  Constitutional:      General: She is not in acute distress.    Appearance: Normal appearance. She is obese. She is not toxic-appearing.  HENT:     Head: Normocephalic and atraumatic.     Right Ear: External ear normal.     Left Ear: External ear normal.     Nose: Nose normal. No congestion or rhinorrhea.     Mouth/Throat:     Mouth: Mucous membranes are moist.     Pharynx: Oropharynx is clear. No oropharyngeal exudate or posterior oropharyngeal erythema.  Eyes:     General: No scleral icterus.    Extraocular Movements: Extraocular movements intact.     Conjunctiva/sclera: Conjunctivae normal.     Pupils: Pupils are equal, round, and reactive to light.  Cardiovascular:     Rate and Rhythm: Normal rate and regular rhythm.     Pulses: Normal pulses.     Heart sounds: Normal heart sounds. No murmur heard.    No friction rub. No gallop.  Pulmonary:     Effort: Pulmonary effort is normal.     Breath sounds: Normal breath sounds. No wheezing, rhonchi or rales.  Abdominal:     General: Abdomen is flat. Bowel sounds are normal. There is no distension.     Palpations: Abdomen is soft.      Tenderness: There is no abdominal tenderness.  Musculoskeletal:        General: No swelling. Normal range of motion.     Cervical back: Normal range of motion.     Right lower leg: No edema.     Left lower leg: No edema.  Lymphadenopathy:     Cervical: No cervical adenopathy.  Skin:    General: Skin is warm and dry.     Capillary Refill: Capillary refill takes less than 2 seconds.     Coloration: Skin is not jaundiced.     Findings: Lesion (Onychomycosis on left foot) present.  Neurological:     General: No focal deficit present.     Mental Status: She is alert and oriented to person, place, and time.  Psychiatric:        Mood and Affect: Mood normal.        Behavior: Behavior normal.    Assessment & Plan:   Problem List Items Addressed This Visit       COPD (chronic obstructive pulmonary disease) (HCC) - Primary    She endorses a history of COPD.  No PFTs on file for review.  She is prescribed albuterol for as needed use, which she reports using roughly once per week.  She is currently asymptomatic.  Pulmonary exam is unremarkable.  No medication changes are indicated today.      GERD (gastroesophageal reflux disease)    History of GERD and multiple gastric ulcers.  She is currently prescribed Pepcid 20 mg daily.  No medication changes have been made today.      Multinodular goiter    She was recently referred to endocrinology in setting of multinodular goiter.  Underwent ultrasound-guided FNA of a right inferior thyroid nodule.  Pathology benign.  Endocrinology follow-up is scheduled for January.      Pain due  to onychomycosis of toenail of left foot    Terbinafine 250 mg daily x 12 weeks prescribed today      Chronic musculoskeletal pain    Followed by [facility], where she receives periodic corticosteroid injections in the setting of diffuse osteoarthritis.  She reports receiving injections in both knees, her left hip, and her back.  She has also been prescribed  hydrocodone-acetaminophen 10-325 mg twice daily as needed for pain relief.  This has been managed by her previous PCP.  PDMP reviewed and is appropriate. -No medication changes have been made today.  Patient will sign a controlled substance agreement and complete UDS.  No refills will be provided until these items are completed.      Generalized anxiety disorder    She is prescribed Paxil 80 mg daily and clonazepam 1 mg 3 times daily for management of anxiety.  She feels that her anxiety is well-controlled on this regimen.  PDMP reviewed and is appropriate. -No medication changes have been made today -I have placed a referral to psychiatry due to complex psychiatric history (GAD, BPD, and insomnia) requiring multiple medications prescribed at doses above the recommended maximum.  She is in agreement with this plan -I will temporarily fill clonazepam, pending completion of controlled substance agreement and UDS.      Bipolar disorder (HCC)    She endorses a history of bipolar disorder that is managed with Paxil 80 mg daily and Risperdal 1 mg daily. -No medication changes have been made today -Patient been referred to psychiatry      Insomnia    She is currently prescribed trazodone 300 mg nightly -As otherwise documented, she has been referred to psychiatry.  No medication changes have been made today.      Hyperlipidemia    No recent lipid panel available for review.  She is currently prescribed atorvastatin 20 mg daily.  Repeat lipid panel ordered today.      Need for influenza vaccination    Influenza vaccine administered today      Return in about 3 months (around 10/06/2023).   Billie Lade, MD

## 2023-07-06 NOTE — Assessment & Plan Note (Signed)
She endorses a history of bipolar disorder that is managed with Paxil 80 mg daily and Risperdal 1 mg daily. -No medication changes have been made today -Patient been referred to psychiatry

## 2023-07-06 NOTE — Patient Instructions (Signed)
It was a pleasure to see you today.  Thank you for giving [place] the opportunity to be involved in your care.  Below is a brief recap of your visit and next steps.  We will plan to see you again in 3 months.   Summary You established care today We will check basic labs Psychiatry referral placed Flu shot today Sign controlled substance agreement and complete UDS Terbinafine prescribed for toenail fungus

## 2023-07-06 NOTE — Assessment & Plan Note (Signed)
She is prescribed Paxil 80 mg daily and clonazepam 1 mg 3 times daily for management of anxiety.  She feels that her anxiety is well-controlled on this regimen.  PDMP reviewed and is appropriate. -No medication changes have been made today -I have placed a referral to psychiatry due to complex psychiatric history (GAD, BPD, and insomnia) requiring multiple medications prescribed at doses above the recommended maximum.  She is in agreement with this plan -I will temporarily fill clonazepam, pending completion of controlled substance agreement and UDS.

## 2023-07-07 ENCOUNTER — Other Ambulatory Visit: Payer: Self-pay | Admitting: Internal Medicine

## 2023-07-07 ENCOUNTER — Encounter: Payer: Self-pay | Admitting: *Deleted

## 2023-07-07 LAB — LIPID PANEL
Chol/HDL Ratio: 4.1 {ratio} (ref 0.0–4.4)
Cholesterol, Total: 182 mg/dL (ref 100–199)
HDL: 44 mg/dL (ref >39)
LDL Chol Calc (NIH): 123 mg/dL — ABNORMAL HIGH (ref 0–99)
Triglycerides: 80 mg/dL (ref 0–149)
VLDL Cholesterol Cal: 15 mg/dL (ref 5–40)

## 2023-07-07 LAB — CMP14+EGFR
ALT: 14 [IU]/L (ref 0–32)
AST: 13 [IU]/L (ref 0–40)
Albumin: 4 g/dL (ref 3.9–4.9)
Alkaline Phosphatase: 82 [IU]/L (ref 44–121)
BUN/Creatinine Ratio: 8 — ABNORMAL LOW (ref 9–23)
BUN: 6 mg/dL (ref 6–24)
Bilirubin Total: 0.3 mg/dL (ref 0.0–1.2)
CO2: 20 mmol/L (ref 20–29)
Calcium: 9 mg/dL (ref 8.7–10.2)
Chloride: 107 mmol/L — ABNORMAL HIGH (ref 96–106)
Creatinine, Ser: 0.73 mg/dL (ref 0.57–1.00)
Globulin, Total: 2.4 g/dL (ref 1.5–4.5)
Glucose: 99 mg/dL (ref 70–99)
Potassium: 4 mmol/L (ref 3.5–5.2)
Sodium: 142 mmol/L (ref 134–144)
Total Protein: 6.4 g/dL (ref 6.0–8.5)
eGFR: 100 mL/min/{1.73_m2} (ref >59)

## 2023-07-07 LAB — CBC WITH DIFFERENTIAL/PLATELET
Basophils Absolute: 0 10*3/uL (ref 0.0–0.2)
Basos: 1 %
EOS (ABSOLUTE): 0.1 10*3/uL (ref 0.0–0.4)
Eos: 1 %
Hematocrit: 45.3 % (ref 34.0–46.6)
Hemoglobin: 14.3 g/dL (ref 11.1–15.9)
Immature Grans (Abs): 0 10*3/uL (ref 0.0–0.1)
Immature Granulocytes: 0 %
Lymphocytes Absolute: 2.1 10*3/uL (ref 0.7–3.1)
Lymphs: 35 %
MCH: 29.9 pg (ref 26.6–33.0)
MCHC: 31.6 g/dL (ref 31.5–35.7)
MCV: 95 fL (ref 79–97)
Monocytes Absolute: 0.4 10*3/uL (ref 0.1–0.9)
Monocytes: 6 %
Neutrophils Absolute: 3.4 10*3/uL (ref 1.4–7.0)
Neutrophils: 57 %
Platelets: 273 10*3/uL (ref 150–450)
RBC: 4.79 x10E6/uL (ref 3.77–5.28)
RDW: 12.5 % (ref 11.7–15.4)
WBC: 5.9 10*3/uL (ref 3.4–10.8)

## 2023-07-07 LAB — HEMOGLOBIN A1C
Est. average glucose Bld gHb Est-mCnc: 114 mg/dL
Hgb A1c MFr Bld: 5.6 % (ref 4.8–5.6)

## 2023-07-07 LAB — HIV ANTIBODY (ROUTINE TESTING W REFLEX): HIV Screen 4th Generation wRfx: NONREACTIVE

## 2023-07-07 LAB — B12 AND FOLATE PANEL
Folate: 3.9 ng/mL (ref >3.0)
Vitamin B-12: 264 pg/mL (ref 232–1245)

## 2023-07-07 LAB — HCV INTERPRETATION

## 2023-07-07 LAB — VITAMIN D 25 HYDROXY (VIT D DEFICIENCY, FRACTURES): Vit D, 25-Hydroxy: 21.9 ng/mL — ABNORMAL LOW (ref 30.0–100.0)

## 2023-07-07 LAB — HCV AB W REFLEX TO QUANT PCR: HCV Ab: NONREACTIVE

## 2023-07-07 MED ORDER — ATORVASTATIN CALCIUM 40 MG PO TABS: 40.0000 mg | ORAL_TABLET | Freq: Every day | ORAL | 3 refills | Status: AC

## 2023-07-10 ENCOUNTER — Telehealth: Payer: Self-pay | Admitting: Internal Medicine

## 2023-07-10 ENCOUNTER — Other Ambulatory Visit: Payer: Self-pay | Admitting: Internal Medicine

## 2023-07-10 ENCOUNTER — Encounter: Payer: Self-pay | Admitting: Nurse Practitioner

## 2023-07-10 NOTE — Telephone Encounter (Signed)
Patient calling says she is needing a refill on HYDROcodone-acetaminophen (NORCO) 10-325 MG tablet [[id number]] says it's supposed to be 2 a day not 2 every 6 hours- changed pharmacy to [facility] on [address] Thank you

## 2023-07-11 ENCOUNTER — Encounter: Payer: Self-pay | Admitting: Internal Medicine

## 2023-07-12 NOTE — Telephone Encounter (Signed)
Patient advised via mychart.

## 2023-07-13 LAB — TOXASSURE SELECT 13 (MW), URINE

## 2023-07-17 ENCOUNTER — Other Ambulatory Visit: Payer: Self-pay | Admitting: Internal Medicine

## 2023-07-17 DIAGNOSIS — M7918 Myalgia, other site: Secondary | ICD-10-CM

## 2023-07-17 MED ORDER — MECLIZINE HCL 25 MG PO TABS: 25.0000 mg | ORAL_TABLET | Freq: Three times a day (TID) | ORAL | 0 refills | Status: AC | PRN

## 2023-07-19 ENCOUNTER — Encounter: Payer: Self-pay | Admitting: Internal Medicine

## 2023-08-01 ENCOUNTER — Encounter: Payer: Self-pay | Admitting: Physical Medicine and Rehabilitation

## 2023-08-16 ENCOUNTER — Encounter: Payer: Medicaid Other | Admitting: Physical Medicine and Rehabilitation

## 2023-10-06 ENCOUNTER — Ambulatory Visit: Payer: Medicaid Other | Admitting: Internal Medicine

## 2023-11-02 ENCOUNTER — Ambulatory Visit: Payer: Medicaid Other | Admitting: Nurse Practitioner

## 2023-11-02 DIAGNOSIS — Z8349 Family history of other endocrine, nutritional and metabolic diseases: Secondary | ICD-10-CM

## 2023-11-02 DIAGNOSIS — R768 Other specified abnormal immunological findings in serum: Secondary | ICD-10-CM

## 2023-11-02 DIAGNOSIS — E042 Nontoxic multinodular goiter: Secondary | ICD-10-CM

## 2023-11-03 LAB — T3, FREE: T3, Free: 2.7 pg/mL (ref 2.0–4.4)

## 2023-11-03 LAB — T4, FREE: Free T4: 0.93 ng/dL (ref 0.82–1.77)

## 2023-11-03 LAB — TSH: TSH: 2.43 u[IU]/mL (ref 0.450–4.500)

## 2023-11-20 ENCOUNTER — Ambulatory Visit (INDEPENDENT_AMBULATORY_CARE_PROVIDER_SITE_OTHER): Payer: Medicaid Other | Admitting: Nurse Practitioner

## 2023-11-20 ENCOUNTER — Encounter: Payer: Self-pay | Admitting: Nurse Practitioner

## 2023-11-20 VITALS — BP 128/90 | HR 67 | Ht 66.0 in | Wt 294.2 lb

## 2023-11-20 DIAGNOSIS — Z8349 Family history of other endocrine, nutritional and metabolic diseases: Secondary | ICD-10-CM

## 2023-11-20 DIAGNOSIS — R768 Other specified abnormal immunological findings in serum: Secondary | ICD-10-CM

## 2023-11-20 DIAGNOSIS — E042 Nontoxic multinodular goiter: Secondary | ICD-10-CM

## 2023-11-20 NOTE — Progress Notes (Signed)
Endocrinology Follow Up Note 11/20/23    ---------------------------------------------------------------------------------------------------------------------- Subjective    Past Medical History:  Diagnosis Date   Anxiety    Arthritis    Cancer (HCC)    Depression    Hypertension    Multiple gastric ulcers    Seizures (HCC)    had 1 seizure 9-10 yrs ago; unknown etiology and no meds. No more seizures    Past Surgical History:  Procedure Laterality Date   BREAST BIOPSY Left 2019   BENIGN FIBROADENOMA   BREAST BIOPSY Right 01/24/2023   [place] RT BREAST BX W LOC DEV 1ST LESION IMG BX SPEC US GUIDE 01/24/2023 Edwin Cap, MD [facility]   CHOLECYSTECTOMY     EYE SURGERY Right    detatched retina   INCISIONAL HERNIA REPAIR N/A 09/08/2014   Procedure: Sherald Hess HERNIORRHAPHY WITH MESH;  Surgeon: Dalia Heading, MD;  Location: [facility];  Service: General;  Laterality: N/A;   INSERTION OF MESH N/A 09/08/2014   Procedure: INSERTION OF MESH;  Surgeon: Dalia Heading, MD;  Location: [facility];  Service: General;  Laterality: N/A;   KNEE SURGERY      Social History   Socioeconomic History   Marital status: Married    Spouse name: Not on file   Number of children: Not on file   Years of education: Not on file   Highest education level: Not on file  Occupational History   Not on file  Tobacco Use   Smoking status: Never   Smokeless tobacco: Never  Vaping Use   Vaping status: Never Used  Substance and Sexual Activity   Alcohol use: No   Drug use: No   Sexual activity: Yes    Birth control/protection: Implant    Comment: implanon  Other Topics Concern   Not on file  Social History Narrative   Not on file   Social Drivers of Health   Financial Resource Strain: Low Risk  (07/29/2022)   Overall Financial Resource Strain (CARDIA)    Difficulty of Paying Living Expenses: Not very hard  Food Insecurity: Food Insecurity Present (07/29/2022)   Hunger Vital Sign    Worried  About Running Out of Food in the Last Year: Sometimes true    Ran Out of Food in the Last Year: Sometimes true  Transportation Needs: No Transportation Needs (07/29/2022)   PRAPARE - Administrator, Civil Service (Medical): No    Lack of Transportation (Non-Medical): No  Physical Activity: Inactive (07/29/2022)   Exercise Vital Sign    Days of Exercise per Week: 0 days    Minutes of Exercise per Session: 0 min  Stress: Stress Concern Present (07/29/2022)   [employer] of Occupational Health - Occupational Stress Questionnaire    Feeling of Stress : To some extent  Social Connections: Socially Integrated (07/29/2022)   Social Connection and Isolation Panel [NHANES]    Frequency of Communication with Friends and Family: More than three times a week    Frequency of Social Gatherings with Friends and Family: More than three times a week    Attends Religious Services: More than 4 times per year    Active Member of [employer] or Organizations: Yes    Attends Engineer, structural: More than 4 times per year    Marital Status: Married  Catering manager Violence: Not At Risk (07/29/2022)   Humiliation, Afraid, Rape, and Kick questionnaire    Fear of Current or Ex-Partner: No    Emotionally Abused: No  Physically Abused: No    Sexually Abused: No    Current Outpatient Medications on File Prior to Visit  Medication Sig Dispense Refill   albuterol (VENTOLIN HFA) 108 (90 Base) MCG/ACT inhaler Inhale 1 puff into the lungs every 4 (four) hours as needed.     atorvastatin (LIPITOR) 40 MG tablet Take 1 tablet (40 mg total) by mouth daily. 90 tablet 3   clonazePAM (KLONOPIN) 1 MG tablet TAKE ONE TABLET BY MOUTH UP TO THREE TIMES DAILY AS NEEDED FOR ANXIETY  0   etonogestrel (IMPLANON) 68 MG IMPL implant Inject 1 each into the skin once.     fluticasone (FLONASE) 50 MCG/ACT nasal spray Place 1 spray into both nostrils 2 (two) times daily. 16 g 2   HYDROcodone-acetaminophen  (NORCO) 10-325 MG tablet Take 1 tablet by mouth every 6 (six) hours as needed.     meclizine (ANTIVERT) 25 MG tablet Take 1 tablet (25 mg total) by mouth 3 (three) times daily as needed for dizziness. 30 tablet 0   PARoxetine (PAXIL) 40 MG tablet Take 40 mg by mouth every morning.     pseudoephedrine (SUDAFED) 60 MG tablet Take 1 tablet (60 mg total) by mouth every 8 (eight) hours as needed for congestion. 20 tablet 0   risperiDONE (RISPERDAL) 1 MG tablet Take 1 tablet (1 mg total) by mouth daily. 30 tablet 0   traZODone (DESYREL) 100 MG tablet Take 300 mg by mouth at bedtime.     diphenhydrAMINE (BENADRYL) 25 MG tablet Take 1 tablet (25 mg total) by mouth every 4 (four) hours as needed. For itching 20 tablet 0   PARoxetine (PAXIL) 40 MG tablet Take 60 mg by mouth daily.     No current facility-administered medications on file prior to visit.      HPI   [name] is a 53 y.o.-year-old female, referred by her PCP, for evaluation for multinodular goiter.  This was an incidental finding during recent exam.  A mass was noted to right neck prompting further evaluation with thyroid ultrasound.  Thyroid U/S: 01/19/23 CLINICAL DATA:  Neck Mass   EXAM: THYROID ULTRASOUND   TECHNIQUE: Ultrasound examination of the thyroid gland and adjacent soft tissues was performed.   COMPARISON:  None available   FINDINGS: Parenchymal Echotexture: Markedly heterogeneous   Isthmus: 0.4 cm   Right lobe: 6.9 x 2.7 x 3.4 cm   Left lobe: 7.1 x 4.5 x 3.0 cm   _________________________________________________________   Estimated total number of nodules >/= 1 cm: 6   Number of spongiform nodules >/=  2 cm not described below (TR1): 0   Number of mixed cystic and solid nodules >/= 1.5 cm not described below (TR2): 0   _________________________________________________________   Nodule # 1:   Location: Right; superior   Maximum size: 2.1 cm; Other 2 dimensions: 1.8 x 2.0 cm   Composition:  solid/almost completely solid (2)   Echogenicity: isoechoic (1)   Shape: not taller-than-wide (0)   Margins: smooth (0)   Echogenic foci: none (0)   ACR TI-RADS total points: 3.   ACR TI-RADS risk category: TR3 (3 points).   ACR TI-RADS recommendations:   *Given size (>/= 1.5 - 2.4 cm) and appearance, a follow-up ultrasound in 1 year should be considered based on TI-RADS criteria.   _________________________________________________________   Nodule 2: 1.2 cm predominantly cystic right mid thyroid nodule does not meet criteria for imaging surveillance or FNA.   _________________________________________________________   Nodule # 3:  Location: Right; mid   Maximum size: 2.4 cm; Other 2 dimensions: 1.5 x 2.4 cm   Composition: solid/almost completely solid (2)   Echogenicity: isoechoic (1)   Shape: not taller-than-wide (0)   Margins: smooth (0)   Echogenic foci: none (0)   ACR TI-RADS total points: 3.   ACR TI-RADS risk category: TR3 (3 points).   ACR TI-RADS recommendations:   *Given size (>/= 1.5 - 2.4 cm) and appearance, a follow-up ultrasound in 1 year should be considered based on TI-RADS criteria.   _________________________________________________________   Nodule # 4:   Location: Right; inferior   Maximum size: 3.1 cm; Other 2 dimensions: 2.5 x 2.3 cm   Composition: solid/almost completely solid (2)   Echogenicity: hypoechoic (2)   Shape: not taller-than-wide (0)   Margins: ill-defined (0)   Echogenic foci: none (0)   ACR TI-RADS total points: 4.   ACR TI-RADS risk category: TR4 (4-6 points).   ACR TI-RADS recommendations:   **Given size (>/= 1.5 cm) and appearance, fine needle aspiration of this moderately suspicious nodule should be considered based on TI-RADS criteria.   _________________________________________________________   Nodule 5: 2.7 x 2.4 x 2.2 cm heterogeneous region in the superior left thyroid lobe favored to be  pseudo nodule given lack of define margins.   _________________________________________________________   Nodule # 6:   Location: Left; inferior   Maximum size: 1.3 cm; Other 2 dimensions: 1.2 x 1.2 cm   Composition: solid/almost completely solid (2)   Echogenicity: hypoechoic (2)   Shape: not taller-than-wide (0)   Margins: smooth (0)   Echogenic foci: none (0)   ACR TI-RADS total points: 4.   ACR TI-RADS risk category: TR4 (4-6 points).   ACR TI-RADS recommendations:   *Given size (>/= 1 - 1.4 cm) and appearance, a follow-up ultrasound in 1 year should be considered based on TI-RADS criteria.   _________________________________________________________   IMPRESSION: 1. Multinodular goiter. 2. Nodules 4 meets criteria for FNA. 3. Nodules 1, 3, and 6 meet criteria for imaging follow-up. Annual ultrasound surveillance is recommended until 5 years of stability is documented.   The above is in keeping with the ACR TI-RADS recommendations - J Am Coll Radiol 2017;14:587-595.     Electronically Signed   By: Acquanetta Belling M.D.   On: 01/20/2023 14:08   I reviewed pt's thyroid tests: Lab Results  Component Value Date   TSH 2.430 11/02/2023   TSH 0.804 06/13/2023   TSH 1.34 07/26/2022   TSH 1.01 04/25/2022   FREET4 0.93 11/02/2023   FREET4 1.18 06/13/2023     She does have family history of thyroid problems, sister was recently diagnosed with multiple thyroid nodules, mom has ?hypothyroidism.  No FH of thyroid cancer. No h/o radiation tx to head or neck.  No seaweed or kelp. No recent contrast studies. No steroid use. No herbal supplements. No Biotin supplements or Hair, Skin and Nails vitamins.  Pt also has a history of Bipolar disorder, anxiety.  Review of systems  Constitutional: + increasing body weight,  current Body mass index is 47.49 kg/m. , no fatigue, + temperature fluctuations Eyes: no blurry vision, no xerophthalmia ENT: + intermittent sore  throat, + nodules palpated in throat, + intermittent dysphagia/odynophagia, no hoarseness Cardiovascular: no chest pain, no shortness of breath, no palpitations, no leg swelling Respiratory:  no shortness of breath Gastrointestinal: no nausea/vomiting/diarrhea Musculoskeletal: no muscle/joint aches Skin: no rashes, no hyperemia Neurological: + tremors (since childhood), no numbness, no tingling, no dizziness Psychiatric:  no depression, + anxiety  ---------------------------------------------------------------------------------------------------------------------- Objective    BP (!) 128/90 (BP Location: Left Arm, Patient Position: Sitting)   Pulse 67   Ht 5\' 6"  (1.676 m)   Wt 294 lb 3.2 oz (133.4 kg)   BMI 47.49 kg/m    BP Readings from Last 3 Encounters:  11/20/23 (!) 128/90  07/06/23 112/78  06/16/23 132/65    Wt Readings from Last 3 Encounters:  11/20/23 294 lb 3.2 oz (133.4 kg)  07/06/23 283 lb 9.6 oz (128.6 kg)  06/13/23 281 lb 6.4 oz (127.6 kg)      Physical Exam- Limited  Constitutional:  Body mass index is 47.49 kg/m. , not in acute distress, normal state of mind Eyes:  EOMI, no exophthalmos Musculoskeletal: no gross deformities, strength intact in all four extremities, no gross restriction of joint movements Skin:  no rashes, no hyperemia Neurological: no tremor with outstretched hands    Latest Reference Range & Units 04/25/22 00:00 07/26/22 00:00 06/13/23 13:40 11/02/23 11:04  TSH 0.450 - 4.500 uIU/mL 1.01 (E) 1.34 (E) 0.804 2.430  Triiodothyronine,Free,Serum 2.0 - 4.4 pg/mL   3.4 2.7  T4,Free(Direct) 0.82 - 1.77 ng/dL   5.62 1.30  Thyroperoxidase Ab SerPl-aCnc 0 - 34 IU/mL   11   Thyroglobulin Antibody 0.0 - 0.9 IU/mL   389.3 (H)   (H): Data is abnormally high (E): External lab result  ----------------------------------------------------------------------------------------------------------------------  ASSESSMENT / PLAN:  1. Thyroid Nodule 2.  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  She does not need antithyroid treatment nor thyroid hormone replacement at this time.  Will repeat labs prior to next visit for surveillance.  Her FNA biopsy came back favoring benignity.  She is due for another follow up ultrasound for surveillance of other nodules.  Will order.   Follow Up Plan: Return in about 4 months (around 03/19/2024) for Thyroid follow up, thyroid ultrasound, Previsit labs.     I spent  32  minutes in the care of the patient today including review of labs from Thyroid Function, CMP, and other relevant labs ; imaging/biopsy records (current and previous including abstractions from other facilities); face-to-face time discussing  her lab results and symptoms, medications doses, her options of short and long term treatment based on the latest standards of care / guidelines;   and documenting the encounter.  [name]  participated in the discussions, expressed understanding, and voiced agreement with the above plans.  All questions were answered to her satisfaction. she is encouraged to contact clinic should she have any questions or concerns prior to her return visit.    Ronny Bacon, [facility] [address] [place], [place] [address] Phone: [phone] Fax: [phone]

## 2023-11-30 ENCOUNTER — Other Ambulatory Visit: Payer: Self-pay

## 2023-11-30 ENCOUNTER — Ambulatory Visit
Admission: EM | Admit: 2023-11-30 | Discharge: 2023-11-30 | Disposition: A | Discharge status: Home / Self Care | Payer: Medicaid Other | Attending: Family Medicine | Admitting: Family Medicine

## 2023-11-30 ENCOUNTER — Encounter: Payer: Self-pay | Admitting: Emergency Medicine

## 2023-11-30 DIAGNOSIS — F319 Bipolar disorder, unspecified: Secondary | ICD-10-CM | POA: Diagnosis not present

## 2023-11-30 DIAGNOSIS — R002 Palpitations: Secondary | ICD-10-CM

## 2023-11-30 DIAGNOSIS — H65192 Other acute nonsuppurative otitis media, left ear: Secondary | ICD-10-CM

## 2023-11-30 DIAGNOSIS — J449 Chronic obstructive pulmonary disease, unspecified: Secondary | ICD-10-CM

## 2023-11-30 MED ORDER — CETIRIZINE HCL 5 MG PO TABS: 5.0000 mg | ORAL_TABLET | Freq: Every day | ORAL | 2 refills | Status: AC

## 2023-11-30 MED ORDER — AZELASTINE HCL 0.1 % NA SOLN: 1.0000 | Freq: Two times a day (BID) | NASAL | 0 refills | Status: AC

## 2023-11-30 NOTE — ED Triage Notes (Addendum)
 Pt reports cough, nasal congestion, continued ear pain x4 weeks. Pt reports was seen at pcp and px abx and finished that on Monday.   Pt reports during this time has experienced memory loss, slurred speech, dizziness. Reports paxil dose was "cut in half" and started on vraylar. Reports symptoms improved but reports fatigue and "felt terrible and seeing things" so stopped taking x4 days ago.

## 2023-11-30 NOTE — Discharge Instructions (Signed)
 I have sent in an additional nasal spray and antihistamine to help with your ongoing middle ear effusions.  You do not currently have an ear infection.  Follow-up as soon as possible with your primary care provider regarding your nighttime palpitations, and your mental health provider regarding your Vraylar side effects and concerns.  Start using an incentive spirometer and focusing on taking deep breaths throughout the day to help keep your oxygen saturations up.  Go to the ER for worsening symptoms at any time.

## 2023-12-04 NOTE — ED Provider Notes (Signed)
 [facility]    CSN: [id number] Arrival date & time: 11/30/23  3474      History   Chief Complaint Chief Complaint  Patient presents with   Cough    HPI [name] is a 53 y.o. female.   Patient presenting today with multiple concerns.  States cough, congestion, ear pain and pressure for the past 4 weeks now.  History of COPD on albuterol.  Not trying anything otherwise over-the-counter for symptoms.  Think she is also having a side effect to Vraylar which she was just started on last week.  She states she took it for several days and started having dizziness, hallucinations, just generally feeling terrible.  Has been feeling bit better since stopping the medication, awaiting follow-up with her prescribing provider for this.  She has a history of anxiety and depression, bipolar disorder currently on Paxil, Klonopin, Risperdal, trazodone.  Denies suicidal or homicidal ideation.    Past Medical History:  Diagnosis Date   Anxiety    Arthritis    Cancer (HCC)    Depression    Hypertension    Multiple gastric ulcers    Seizures (HCC)    had 1 seizure 9-10 yrs ago; unknown etiology and no meds. No more seizures    Patient Active Problem List   Diagnosis Date Noted   COPD (chronic obstructive pulmonary disease) (HCC) 07/06/2023   Multinodular goiter 07/06/2023   Chronic musculoskeletal pain 07/06/2023   Generalized anxiety disorder 07/06/2023   Bipolar disorder (HCC) 07/06/2023   Insomnia 07/06/2023   Hyperlipidemia 07/06/2023   GERD (gastroesophageal reflux disease) 07/06/2023   Pain due to onychomycosis of toenail of left foot 07/06/2023   Need for influenza vaccination 07/06/2023    Past Surgical History:  Procedure Laterality Date   BREAST BIOPSY Left 2019   BENIGN FIBROADENOMA   BREAST BIOPSY Right 01/24/2023   [place] RT BREAST BX W LOC DEV 1ST LESION IMG BX SPEC US GUIDE 01/24/2023 Edwin Cap, MD [facility]   CHOLECYSTECTOMY     EYE SURGERY  Right    detatched retina   INCISIONAL HERNIA REPAIR N/A 09/08/2014   Procedure: Sherald Hess HERNIORRHAPHY WITH MESH;  Surgeon: Dalia Heading, MD;  Location: [facility];  Service: General;  Laterality: N/A;   INSERTION OF MESH N/A 09/08/2014   Procedure: INSERTION OF MESH;  Surgeon: Dalia Heading, MD;  Location: [facility];  Service: General;  Laterality: N/A;   KNEE SURGERY      OB History     Gravida  3   Para  2   Term  2   Preterm  0   AB  1   Living  2      SAB      IAB      Ectopic      Multiple      Live Births               Home Medications    Prior to Admission medications   Medication Sig Start Date End Date Taking? Authorizing Provider  azelastine (ASTELIN) 0.1 % nasal spray Place 1 spray into both nostrils 2 (two) times daily. Use in each nostril as directed 11/30/23  Yes Particia Nearing, PA-C  cetirizine (ZYRTEC) 5 MG tablet Take 1 tablet (5 mg total) by mouth daily. 11/30/23  Yes Particia Nearing, PA-C  albuterol (VENTOLIN HFA) 108 (90 Base) MCG/ACT inhaler Inhale 1 puff into the lungs every 4 (four) hours as needed.  [provider]  atorvastatin (LIPITOR) 40 MG tablet Take 1 tablet (40 mg total) by mouth daily. 07/07/23   Billie Lade, MD  clonazePAM (KLONOPIN) 1 MG tablet TAKE ONE TABLET BY MOUTH UP TO THREE TIMES DAILY AS NEEDED FOR ANXIETY 03/13/17   [provider]  etonogestrel (IMPLANON) 68 MG IMPL implant Inject 1 each into the skin once.    [provider]  fluticasone (FLONASE) 50 MCG/ACT nasal spray Place 1 spray into both nostrils 2 (two) times daily. 05/12/23   Particia Nearing, PA-C  HYDROcodone-acetaminophen [facility]) 10-325 MG tablet Take 1 tablet by mouth every 6 (six) hours as needed.    [provider]  meclizine (ANTIVERT) 25 MG tablet Take 1 tablet (25 mg total) by mouth 3 (three) times daily as needed for dizziness. 07/17/23   Billie Lade, MD  PARoxetine (PAXIL) 40 MG tablet  Take 60 mg by mouth daily.    [provider]  PARoxetine (PAXIL) 40 MG tablet Take 40 mg by mouth every morning.    [provider]  pseudoephedrine (SUDAFED) 60 MG tablet Take 1 tablet (60 mg total) by mouth every 8 (eight) hours as needed for congestion. 05/12/23   Particia Nearing, PA-C  risperiDONE (RISPERDAL) 1 MG tablet Take 1 tablet (1 mg total) by mouth daily. 03/31/19   Law, Waylan Boga, PA-C  traZODone (DESYREL) 100 MG tablet Take 300 mg by mouth at bedtime.    [provider]    Family History History reviewed. No pertinent family history.  Social History Social History   Tobacco Use   Smoking status: Never   Smokeless tobacco: Never  Vaping Use   Vaping status: Never Used  Substance Use Topics   Alcohol use: No   Drug use: No     Allergies   Naproxen and Ultram [tramadol hcl]   Review of Systems Review of Systems PER HPI  Physical Exam Triage Vital Signs ED Triage Vitals  Encounter Vitals Group     BP 11/30/23 0929 117/78     Systolic BP Percentile --      Diastolic BP Percentile --      Pulse Rate 11/30/23 0929 89     Resp 11/30/23 0929 20     Temp 11/30/23 0929 98.4 F (36.9 C)     Temp Source 11/30/23 0929 Oral     SpO2 11/30/23 0929 91 %     Weight --      Height --      Head Circumference --      Peak Flow --      Pain Score 11/30/23 0936 0     Pain Loc --      Pain Education --      Exclude from Growth Chart --    No data found.  Updated Vital Signs BP 117/78 (BP Location: Right Arm)   Pulse 89   Temp 98.4 F (36.9 C) (Oral)   Resp 20   SpO2 91%   Visual Acuity Right Eye Distance:   Left Eye Distance:   Bilateral Distance:    Right Eye Near:   Left Eye Near:    Bilateral Near:     Physical Exam Vitals and nursing note reviewed.  Constitutional:      Appearance: Normal appearance.  HENT:     Head: Atraumatic.     Right Ear: Tympanic membrane and external ear normal.     Left Ear: External  ear normal.  Ears:     Comments: Left middle ear effusion    Nose: Congestion present.     Mouth/Throat:     Mouth: Mucous membranes are moist.     Pharynx: Posterior oropharyngeal erythema present.  Eyes:     Extraocular Movements: Extraocular movements intact.     Conjunctiva/sclera: Conjunctivae normal.  Cardiovascular:     Rate and Rhythm: Normal rate and regular rhythm.     Heart sounds: Normal heart sounds.  Pulmonary:     Effort: Pulmonary effort is normal.     Breath sounds: No wheezing.  Musculoskeletal:        General: Normal range of motion.     Cervical back: Normal range of motion and neck supple.  Skin:    General: Skin is warm and dry.  Neurological:     Mental Status: She is alert and oriented to person, place, and time.  Psychiatric:        Thought Content: Thought content normal.     Comments: Tearful, anxious      UC Treatments / Results  Labs (all labs ordered are listed, but only abnormal results are displayed) Labs Reviewed - No data to display  EKG   Radiology No results found.  Procedures Procedures (including critical care time)  Medications Ordered in UC Medications - No data to display  Initial Impression / Assessment and Plan / UC Course  I have reviewed the triage vital signs and the nursing notes.  Pertinent labs & imaging results that were available during my care of the patient were reviewed by me and considered in my medical decision making (see chart for details).     Vitals and exam overall reassuring today, continue holding the Vraylar and follow-up with soon as possible with prescribing provider for further recommendations.  Follow-up sooner if having any suicidal or homicidal ideation in the emergency department.  Will add Astelin nasal spray, Zyrtec for the ongoing middle ear effusion and congestion and cough.  Continue albuterol for COPD as needed.  Follow-up as soon as possible for PCP recheck.  Final Clinical  Impressions(s) / UC Diagnoses   Final diagnoses:  Acute MEE (middle ear effusion), left  Bipolar affective disorder, remission status unspecified (HCC)  Chronic obstructive pulmonary disease, unspecified COPD type (HCC)  Palpitations     Discharge Instructions      I have sent in an additional nasal spray and antihistamine to help with your ongoing middle ear effusions.  You do not currently have an ear infection.  Follow-up as soon as possible with your primary care provider regarding your nighttime palpitations, and your mental health provider regarding your Vraylar side effects and concerns.  Start using an incentive spirometer and focusing on taking deep breaths throughout the day to help keep your oxygen saturations up.  Go to the ER for worsening symptoms at any time.    ED Prescriptions     Medication Sig Dispense Auth. Provider   azelastine (ASTELIN) 0.1 % nasal spray Place 1 spray into both nostrils 2 (two) times daily. Use in each nostril as directed 30 mL Particia Nearing, PA-C   cetirizine (ZYRTEC) 5 MG tablet Take 1 tablet (5 mg total) by mouth daily. 30 tablet Particia Nearing, [place]      PDMP not reviewed this encounter.   Particia Nearing, [place] 12/04/23 1914

## 2023-12-27 ENCOUNTER — Ambulatory Visit (HOSPITAL_COMMUNITY)
Admission: RE | Admit: 2023-12-27 | Discharge: 2023-12-27 | Disposition: A | Discharge status: Home / Self Care | Source: Ambulatory Visit | Attending: Nurse Practitioner | Admitting: Nurse Practitioner

## 2023-12-27 DIAGNOSIS — Z8349 Family history of other endocrine, nutritional and metabolic diseases: Secondary | ICD-10-CM | POA: Diagnosis present

## 2023-12-27 DIAGNOSIS — E042 Nontoxic multinodular goiter: Secondary | ICD-10-CM | POA: Diagnosis present

## 2023-12-27 DIAGNOSIS — R768 Other specified abnormal immunological findings in serum: Secondary | ICD-10-CM | POA: Insufficient documentation

## 2024-01-08 ENCOUNTER — Other Ambulatory Visit (HOSPITAL_COMMUNITY): Payer: Self-pay | Admitting: Nurse Practitioner

## 2024-01-08 ENCOUNTER — Encounter: Payer: Self-pay | Admitting: Nurse Practitioner

## 2024-01-08 DIAGNOSIS — Z1231 Encounter for screening mammogram for malignant neoplasm of breast: Secondary | ICD-10-CM

## 2024-01-09 ENCOUNTER — Telehealth: Payer: Self-pay | Admitting: *Deleted

## 2024-01-09 NOTE — Telephone Encounter (Signed)
 Noted that a message was sent to the patient in MyChart about her results.

## 2024-01-09 NOTE — Telephone Encounter (Signed)
-----   Message from [name] sent at 01/08/2024 12:47 PM EDT ----- Lorain Childes I sent mychart message going over recent thyroid ultrasound.

## 2024-01-22 ENCOUNTER — Encounter (HOSPITAL_COMMUNITY): Payer: Self-pay

## 2024-01-22 ENCOUNTER — Ambulatory Visit (HOSPITAL_COMMUNITY)
Admission: RE | Admit: 2024-01-22 | Discharge: 2024-01-22 | Disposition: A | Discharge status: Home / Self Care | Source: Ambulatory Visit | Attending: Nurse Practitioner | Admitting: Nurse Practitioner

## 2024-01-22 DIAGNOSIS — Z1231 Encounter for screening mammogram for malignant neoplasm of breast: Secondary | ICD-10-CM | POA: Insufficient documentation

## 2024-03-06 LAB — TSH: TSH: 0.683 u[IU]/mL (ref 0.450–4.500)

## 2024-03-06 LAB — T4, FREE: Free T4: 0.92 ng/dL (ref 0.82–1.77)

## 2024-03-06 LAB — T3, FREE: T3, Free: 3.2 pg/mL (ref 2.0–4.4)

## 2024-03-19 ENCOUNTER — Ambulatory Visit: Payer: Medicaid Other | Admitting: Nurse Practitioner

## 2024-03-19 ENCOUNTER — Encounter: Payer: Self-pay | Admitting: Nurse Practitioner

## 2024-03-19 VITALS — BP 122/76 | HR 80 | Ht 66.0 in | Wt 294.6 lb

## 2024-03-19 DIAGNOSIS — R768 Other specified abnormal immunological findings in serum: Secondary | ICD-10-CM | POA: Diagnosis not present

## 2024-03-19 DIAGNOSIS — Z8349 Family history of other endocrine, nutritional and metabolic diseases: Secondary | ICD-10-CM

## 2024-03-19 DIAGNOSIS — E042 Nontoxic multinodular goiter: Secondary | ICD-10-CM | POA: Diagnosis not present

## 2024-03-19 NOTE — Progress Notes (Signed)
 Endocrinology Follow Up Note 03/19/24    ---------------------------------------------------------------------------------------------------------------------- Subjective    Past Medical History:  Diagnosis Date   Anxiety    Arthritis    Cancer (HCC)    Depression    Hypertension    Multiple gastric ulcers    Seizures (HCC)    had 1 seizure 9-10 yrs ago; unknown etiology and no meds. No more seizures    Past Surgical History:  Procedure Laterality Date   BREAST BIOPSY Left 2019   BENIGN FIBROADENOMA   BREAST BIOPSY Right 01/24/2023   Fibroadenoma/US  RT BREAST BX W LOC DEV 1ST LESION IMG BX SPEC US  GUIDE 01/24/2023 Alger Infield, MD [facility]   CHOLECYSTECTOMY     EYE SURGERY Right    detatched retina   INCISIONAL HERNIA REPAIR N/A 09/08/2014   Procedure: Estrella Hench HERNIORRHAPHY WITH MESH;  Surgeon: Beau Bound, MD;  Location: [facility];  Service: General;  Laterality: N/A;   INSERTION OF MESH N/A 09/08/2014   Procedure: INSERTION OF MESH;  Surgeon: Beau Bound, MD;  Location: [facility];  Service: General;  Laterality: N/A;   KNEE SURGERY      Social History   Socioeconomic History   Marital status: Married    Spouse name: Not on file   Number of children: Not on file   Years of education: Not on file   Highest education level: Not on file  Occupational History   Not on file  Tobacco Use   Smoking status: Never   Smokeless tobacco: Never  Vaping Use   Vaping status: Never Used  Substance and Sexual Activity   Alcohol use: No   Drug use: No   Sexual activity: Yes    Birth control/protection: Implant    Comment: implanon  Other Topics Concern   Not on file  Social History Narrative   Not on file   Social Drivers of Health   Financial Resource Strain: Low Risk  (07/29/2022)   Overall Financial Resource Strain (CARDIA)    Difficulty of Paying Living Expenses: Not very hard  Food Insecurity: Food Insecurity Present (07/29/2022)   Hunger Vital Sign     Worried About Running Out of Food in the Last Year: Sometimes true    Ran Out of Food in the Last Year: Sometimes true  Transportation Needs: No Transportation Needs (07/29/2022)   PRAPARE - Administrator, Civil Service (Medical): No    Lack of Transportation (Non-Medical): No  Physical Activity: Inactive (07/29/2022)   Exercise Vital Sign    Days of Exercise per Week: 0 days    Minutes of Exercise per Session: 0 min  Stress: Stress Concern Present (07/29/2022)   [employer] of Occupational Health - Occupational Stress Questionnaire    Feeling of Stress : To some extent  Social Connections: Socially Integrated (07/29/2022)   Social Connection and Isolation Panel    Frequency of Communication with Friends and Family: More than three times a week    Frequency of Social Gatherings with Friends and Family: More than three times a week    Attends Religious Services: More than 4 times per year    Active Member of [employer] or Organizations: Yes    Attends Engineer, structural: More than 4 times per year    Marital Status: Married  Catering manager Violence: Not At Risk (07/29/2022)   Humiliation, Afraid, Rape, and Kick questionnaire    Fear of Current or Ex-Partner: No    Emotionally Abused: No  Physically Abused: No    Sexually Abused: No    Current Outpatient Medications on File Prior to Visit  Medication Sig Dispense Refill   albuterol  (VENTOLIN  HFA) 108 (90 Base) MCG/ACT inhaler Inhale 1 puff into the lungs every 4 (four) hours as needed.     atorvastatin  (LIPITOR) 40 MG tablet Take 1 tablet (40 mg total) by mouth daily. 90 tablet 3   azelastine  (ASTELIN ) 0.1 % nasal spray Place 1 spray into both nostrils 2 (two) times daily. Use in each nostril as directed 30 mL 0   cetirizine  (ZYRTEC ) 5 MG tablet Take 1 tablet (5 mg total) by mouth daily. 30 tablet 2   clonazePAM (KLONOPIN) 1 MG tablet TAKE ONE TABLET BY MOUTH UP TO THREE TIMES DAILY AS NEEDED FOR  ANXIETY  0   etonogestrel (IMPLANON) 68 MG IMPL implant Inject 1 each into the skin once.     fluticasone  (FLONASE ) 50 MCG/ACT nasal spray Place 1 spray into both nostrils 2 (two) times daily. 16 g 2   HYDROcodone -acetaminophen  (NORCO) 10-325 MG tablet Take 1 tablet by mouth every 6 (six) hours as needed.     meclizine  (ANTIVERT ) 25 MG tablet Take 1 tablet (25 mg total) by mouth 3 (three) times daily as needed for dizziness. 30 tablet 0   PARoxetine (PAXIL) 40 MG tablet Take 60 mg by mouth daily.     PARoxetine (PAXIL) 40 MG tablet Take 40 mg by mouth every morning.     pseudoephedrine  (SUDAFED) 60 MG tablet Take 1 tablet (60 mg total) by mouth every 8 (eight) hours as needed for congestion. 20 tablet 0   risperiDONE  (RISPERDAL ) 1 MG tablet Take 1 tablet (1 mg total) by mouth daily. 30 tablet 0   traZODone (DESYREL) 100 MG tablet Take 300 mg by mouth at bedtime.     No current facility-administered medications on file prior to visit.      HPI   [name] is a 53 y.o.-year-old female, referred by her PCP, for evaluation for multinodular goiter.  This was an incidental finding during recent exam.  A mass was noted to right neck prompting further evaluation with thyroid  ultrasound.  Thyroid  U/S: 01/19/23 CLINICAL DATA:  Neck Mass   EXAM: THYROID  ULTRASOUND   TECHNIQUE: Ultrasound examination of the thyroid  gland and adjacent soft tissues was performed.   COMPARISON:  None available   FINDINGS: Parenchymal Echotexture: Markedly heterogeneous   Isthmus: 0.4 cm   Right lobe: 6.9 x 2.7 x 3.4 cm   Left lobe: 7.1 x 4.5 x 3.0 cm   _________________________________________________________   Estimated total number of nodules >/= 1 cm: 6   Number of spongiform nodules >/=  2 cm not described below (TR1): 0   Number of mixed cystic and solid nodules >/= 1.5 cm not described below (TR2): 0   _________________________________________________________   Nodule # 1:    Location: Right; superior   Maximum size: 2.1 cm; Other 2 dimensions: 1.8 x 2.0 cm   Composition: solid/almost completely solid (2)   Echogenicity: isoechoic (1)   Shape: not taller-than-wide (0)   Margins: smooth (0)   Echogenic foci: none (0)   ACR TI-RADS total points: 3.   ACR TI-RADS risk category: TR3 (3 points).   ACR TI-RADS recommendations:   *Given size (>/= 1.5 - 2.4 cm) and appearance, a follow-up ultrasound in 1 year should be considered based on TI-RADS criteria.   _________________________________________________________   Nodule 2: 1.2 cm predominantly cystic right mid  thyroid  nodule does not meet criteria for imaging surveillance or FNA.   _________________________________________________________   Nodule # 3:   Location: Right; mid   Maximum size: 2.4 cm; Other 2 dimensions: 1.5 x 2.4 cm   Composition: solid/almost completely solid (2)   Echogenicity: isoechoic (1)   Shape: not taller-than-wide (0)   Margins: smooth (0)   Echogenic foci: none (0)   ACR TI-RADS total points: 3.   ACR TI-RADS risk category: TR3 (3 points).   ACR TI-RADS recommendations:   *Given size (>/= 1.5 - 2.4 cm) and appearance, a follow-up ultrasound in 1 year should be considered based on TI-RADS criteria.   _________________________________________________________   Nodule # 4:   Location: Right; inferior   Maximum size: 3.1 cm; Other 2 dimensions: 2.5 x 2.3 cm   Composition: solid/almost completely solid (2)   Echogenicity: hypoechoic (2)   Shape: not taller-than-wide (0)   Margins: ill-defined (0)   Echogenic foci: none (0)   ACR TI-RADS total points: 4.   ACR TI-RADS risk category: TR4 (4-6 points).   ACR TI-RADS recommendations:   **Given size (>/= 1.5 cm) and appearance, fine needle aspiration of this moderately suspicious nodule should be considered based on TI-RADS criteria.    _________________________________________________________   Nodule 5: 2.7 x 2.4 x 2.2 cm heterogeneous region in the superior left thyroid  lobe favored to be pseudo nodule given lack of define margins.   _________________________________________________________   Nodule # 6:   Location: Left; inferior   Maximum size: 1.3 cm; Other 2 dimensions: 1.2 x 1.2 cm   Composition: solid/almost completely solid (2)   Echogenicity: hypoechoic (2)   Shape: not taller-than-wide (0)   Margins: smooth (0)   Echogenic foci: none (0)   ACR TI-RADS total points: 4.   ACR TI-RADS risk category: TR4 (4-6 points).   ACR TI-RADS recommendations:   *Given size (>/= 1 - 1.4 cm) and appearance, a follow-up ultrasound in 1 year should be considered based on TI-RADS criteria.   _________________________________________________________   IMPRESSION: 1. Multinodular goiter. 2. Nodules 4 meets criteria for FNA. 3. Nodules 1, 3, and 6 meet criteria for imaging follow-up. Annual ultrasound surveillance is recommended until 5 years of stability is documented.   The above is in keeping with the ACR TI-RADS recommendations - J Am Coll Radiol 2017;14:587-595.     Electronically Signed   By: Elester Grim M.D.   On: 01/20/2023 14:08   I reviewed pt's thyroid  tests: Lab Results  Component Value Date   TSH 0.683 03/05/2024   TSH 2.430 11/02/2023   TSH 0.804 06/13/2023   TSH 1.34 07/26/2022   TSH 1.01 04/25/2022   FREET4 0.92 03/05/2024   FREET4 0.93 11/02/2023   FREET4 1.18 06/13/2023     She does have family history of thyroid  problems, sister was recently diagnosed with multiple thyroid  nodules, mom has ?hypothyroidism.  No FH of thyroid  cancer. No h/o radiation tx to head or neck.  No seaweed or kelp. No recent contrast studies. No steroid use. No herbal supplements. No Biotin supplements or Hair, Skin and Nails vitamins.  Pt also has a history of Bipolar disorder,  anxiety.  Review of systems  Constitutional: + decreasing body weight (recently started on Zepbound by her PCP),  current Body mass index is 47.55 kg/m. , no fatigue, + temperature fluctuations Eyes: no blurry vision, no xerophthalmia ENT: + intermittent sore throat, + nodules palpated in throat, + intermittent dysphagia/odynophagia, no hoarseness, + having inner ear issues  Cardiovascular: no chest pain, no shortness of breath, no palpitations, no leg swelling Respiratory:  no shortness of breath Gastrointestinal: no nausea/vomiting/diarrhea Musculoskeletal: no muscle/joint aches Skin: no rashes, no hyperemia Neurological: + tremors (since childhood), no numbness, no tingling, no dizziness Psychiatric: no depression, + anxiety  ---------------------------------------------------------------------------------------------------------------------- Objective    BP 122/76 (BP Location: Right Arm, Patient Position: Sitting, Cuff Size: Large)   Pulse 80   Ht 5' 6 (1.676 m)   Wt 294 lb 9.6 oz (133.6 kg)   BMI 47.55 kg/m    BP Readings from Last 3 Encounters:  03/19/24 122/76  11/30/23 117/78  11/20/23 (!) 128/90    Wt Readings from Last 3 Encounters:  03/19/24 294 lb 9.6 oz (133.6 kg)  11/20/23 294 lb 3.2 oz (133.4 kg)  07/06/23 283 lb 9.6 oz (128.6 kg)      Physical Exam- Limited  Constitutional:  Body mass index is 47.55 kg/m. , not in acute distress, normal state of mind Eyes:  EOMI, no exophthalmos Musculoskeletal: no gross deformities, strength intact in all four extremities, no gross restriction of joint movements Skin:  no rashes, no hyperemia Neurological: no tremor with outstretched hands    Thyroid  US  from 12/27/23  CLINICAL DATA:  Goiter. History of right inferior thyroid  nodule previously biopsied in September of 2024. Three additional nodules (right superior, right mid and left inferior) are currently under imaging surveillance.   EXAM: THYROID   ULTRASOUND   TECHNIQUE: Ultrasound examination of the thyroid  gland and adjacent soft tissues was performed.   COMPARISON:  Prior thyroid  ultrasound 01/19/2023   FINDINGS: Parenchymal Echotexture: Markedly heterogenous   Isthmus: 0.6 cm   Right lobe: 7.2 x 3.0 x 3.1 cm   Left lobe: 6.9 x 3.2 x 3.2 cm   _________________________________________________________   Estimated total number of nodules >/= 1 cm: 5   Number of spongiform nodules >/=  2 cm not described below (TR1): 0   Number of mixed cystic and solid nodules >/= 1.5 cm not described below (TR2): 0   _________________________________________________________   Nodule # 1: Isoechoic solid nodule in the right upper gland is stable at 2.2 x 1.9 x 2.2 cm compared to 2.1 x 1.8 x 2.0 cm previously. Findings remain consistent with TI-RADS category 3. *Given size (>/= 1.5 - 2.4 cm) and appearance, a follow-up ultrasound in 1 year should be considered based on TI-RADS criteria.   Nodule # 2: Small predominantly solid isoechoic nodule in the right mid gland measures no more than 1.3 cm. TI-RADS category 3. Given size (<1.4 cm) and appearance, this nodule does NOT meet TI-RADS criteria for biopsy or dedicated follow-up.   Nodule # 3: Ill-defined solid isoechoic nodule in the medial aspect of the right mid gland remains unchanged at 2.4 by 1.9 x 2.3 cm. Findings remain consistent with TI-RADS category 3. *Given size (>/= 1.5 - 2.4 cm) and appearance, a follow-up ultrasound in 1 year should be considered based on TI-RADS criteria.   Nodule # 4: Previously biopsied solid isoechoic nodule in the right lower gland is difficult to measure with precision due to the ill-defined nature of the lesion. It is grossly unchanged at 3.2 x 2.6 by 3.2 cm.   Nodule # 6: Ill-defined hypoechoic solid nodule in the left lower gland is unchanged at 1.3 x 1.1 x 1.1 cm. Findings are consistent with TI-RADS category 4. *Given size (>/= 1 - 1.4  cm) and appearance, a follow-up ultrasound in 1 year should be considered based on TI-RADS  criteria.   IMPRESSION: 1. No significant interval change in the size or appearance of the previously biopsied nodule in the r 1. No significant interval change in the size or appearance of the previously biopsied nodule in the right lower gland. Recommend correlation with prior biopsy results. 2. Nodules # 1, 3 and 6 are all stable. This exam marks 1 year of stability. Lesions continue to meet criteria for imaging surveillance. Recommend annual follow-up ultrasound until 5 years of stability are confirmed.   The above is in keeping with the ACR TI-RADS recommendations Phares [employer] Radiol 2017;14:[id number]     Latest Reference Range & Units 04/25/22 00:00 07/26/22 00:00 06/13/23 13:40 11/02/23 11:04 03/05/24 09:54  TSH 0.450 - 4.500 uIU/mL 1.01 (E) 1.34 (E) 0.804 2.430 0.683  Triiodothyronine,Free,Serum 2.0 - 4.4 pg/mL   3.4 2.7 3.2  T4,Free(Direct) 0.82 - 1.77 ng/dL   0.98 1.19 1.47  Thyroperoxidase Ab SerPl-aCnc 0 - 34 IU/mL   11    Thyroglobulin Antibody 0.0 - 0.9 IU/mL   389.3 (H)    (H): Data is abnormally high (E): External lab result  ----------------------------------------------------------------------------------------------------------------------  ASSESSMENT / PLAN:  1. Thyroid  Nodule 2. 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  She does not need antithyroid treatment nor thyroid  hormone replacement at this time.  Will repeat labs prior to next visit in 6 months for surveillance.  Her previous FNA biopsy came back favoring benignity.  She had a follow up thyroid  ultrasound showing nodules that meet criteria for surveillance with another ultrasound in 1 year, no new nodules, no significant changes.  Will plan to repeat  ultrasound in March of 2026.     Follow Up Plan: Return in about 6 months (around 09/18/2024) for Thyroid  follow up, Previsit labs.      I spent  26  minutes in the care of the patient today including review of labs from Thyroid  Function, CMP, and other relevant labs ; imaging/biopsy records (current and previous including abstractions from other facilities); face-to-face time discussing  her lab results and symptoms, medications doses, her options of short and long term treatment based on the latest standards of care / guidelines;   and documenting the encounter.  Tilford Foley  participated in the discussions, expressed understanding, and voiced agreement with the above plans.  All questions were answered to her satisfaction. she is encouraged to contact clinic should she have any questions or concerns prior to her return visit.    Hulon Magic, [facility] [address] [place], [place] [address] Phone: [phone] Fax: [phone]

## 2024-04-09 ENCOUNTER — Other Ambulatory Visit (HOSPITAL_COMMUNITY)
Admission: RE | Admit: 2024-04-09 | Discharge: 2024-04-09 | Disposition: A | Discharge status: Home / Self Care | Source: Ambulatory Visit | Attending: Nurse Practitioner | Admitting: Nurse Practitioner

## 2024-04-09 DIAGNOSIS — L989 Disorder of the skin and subcutaneous tissue, unspecified: Secondary | ICD-10-CM | POA: Diagnosis present

## 2024-04-12 LAB — SURGICAL PATHOLOGY

## 2024-07-16 ENCOUNTER — Other Ambulatory Visit (HOSPITAL_COMMUNITY)
Admission: RE | Admit: 2024-07-16 | Discharge: 2024-07-16 | Disposition: A | Discharge status: Home / Self Care | Source: Ambulatory Visit | Attending: Nurse Practitioner | Admitting: Nurse Practitioner

## 2024-07-16 DIAGNOSIS — N9089 Other specified noninflammatory disorders of vulva and perineum: Secondary | ICD-10-CM | POA: Insufficient documentation

## 2024-07-22 LAB — SURGICAL PATHOLOGY

## 2024-09-11 ENCOUNTER — Other Ambulatory Visit: Payer: Self-pay | Admitting: *Deleted

## 2024-09-11 DIAGNOSIS — Z8349 Family history of other endocrine, nutritional and metabolic diseases: Secondary | ICD-10-CM

## 2024-09-11 DIAGNOSIS — R7689 Other specified abnormal immunological findings in serum: Secondary | ICD-10-CM

## 2024-09-11 DIAGNOSIS — E042 Nontoxic multinodular goiter: Secondary | ICD-10-CM

## 2024-09-12 LAB — T4, FREE: Free T4: 0.93 ng/dL (ref 0.82–1.77)

## 2024-09-12 LAB — T3, FREE: T3, Free: 3.1 pg/mL (ref 2.0–4.4)

## 2024-09-12 LAB — TSH: TSH: 0.921 u[IU]/mL (ref 0.450–4.500)

## 2024-09-18 ENCOUNTER — Encounter: Payer: Self-pay | Admitting: Nurse Practitioner

## 2024-09-18 ENCOUNTER — Ambulatory Visit: Admitting: Nurse Practitioner

## 2024-09-18 VITALS — BP 98/70 | HR 62 | Ht 66.0 in | Wt 288.6 lb

## 2024-09-18 DIAGNOSIS — E042 Nontoxic multinodular goiter: Secondary | ICD-10-CM | POA: Diagnosis not present

## 2024-09-18 DIAGNOSIS — R7689 Other specified abnormal immunological findings in serum: Secondary | ICD-10-CM

## 2024-09-18 DIAGNOSIS — Z8349 Family history of other endocrine, nutritional and metabolic diseases: Secondary | ICD-10-CM | POA: Diagnosis not present

## 2024-09-18 NOTE — Progress Notes (Signed)
 Endocrinology Follow Up Note 09/18/2024    ---------------------------------------------------------------------------------------------------------------------- Subjective    Past Medical History:  Diagnosis Date   Anxiety    Arthritis    Cancer (HCC)    Depression    Hypertension    Multiple gastric ulcers    Seizures (HCC)    had 1 seizure 9-10 yrs ago; unknown etiology and no meds. No more seizures    Past Surgical History:  Procedure Laterality Date   BREAST BIOPSY Left 2019   BENIGN FIBROADENOMA   BREAST BIOPSY Right 01/24/2023   Fibroadenoma/US  RT BREAST BX W LOC DEV 1ST LESION IMG BX SPEC US  GUIDE 01/24/2023 Lennon Nest, MD [facility]   CHOLECYSTECTOMY     EYE SURGERY Right    detatched retina   INCISIONAL HERNIA REPAIR N/A 09/08/2014   Procedure: SHIRLENE HERNIORRHAPHY WITH MESH;  Surgeon: Oneil DELENA Budge, MD;  Location: [facility];  Service: General;  Laterality: N/A;   INSERTION OF MESH N/A 09/08/2014   Procedure: INSERTION OF MESH;  Surgeon: Oneil DELENA Budge, MD;  Location: [facility];  Service: General;  Laterality: N/A;   KNEE SURGERY      Social History   Socioeconomic History   Marital status: Married    Spouse name: Not on file   Number of children: Not on file   Years of education: Not on file   Highest education level: Not on file  Occupational History   Not on file  Tobacco Use   Smoking status: Never   Smokeless tobacco: Never  Vaping Use   Vaping status: Never Used  Substance and Sexual Activity   Alcohol use: No   Drug use: No   Sexual activity: Yes    Birth control/protection: Implant    Comment: implanon  Other Topics Concern   Not on file  Social History Narrative   Not on file   Social Drivers of Health   Tobacco Use: Low Risk (09/18/2024)   Patient History    Smoking Tobacco Use: Never    Smokeless Tobacco Use: Never    Passive Exposure: Not on file  Financial Resource Strain: Low Risk (07/29/2022)   Overall Financial  Resource Strain (CARDIA)    Difficulty of Paying Living Expenses: Not very hard  Food Insecurity: Food Insecurity Present (07/29/2022)   Hunger Vital Sign    Worried About Running Out of Food in the Last Year: Sometimes true    Ran Out of Food in the Last Year: Sometimes true  Transportation Needs: No Transportation Needs (07/29/2022)   PRAPARE - Administrator, Civil Service (Medical): No    Lack of Transportation (Non-Medical): No  Physical Activity: Inactive (07/29/2022)   Exercise Vital Sign    Days of Exercise per Week: 0 days    Minutes of Exercise per Session: 0 min  Stress: Stress Concern Present (07/29/2022)   [employer] of Occupational Health - Occupational Stress Questionnaire    Feeling of Stress : To some extent  Social Connections: Socially Integrated (07/29/2022)   Social Connection and Isolation Panel    Frequency of Communication with Friends and Family: More than three times a week    Frequency of Social Gatherings with Friends and Family: More than three times a week    Attends Religious Services: More than 4 times per year    Active Member of [employer] or Organizations: Yes    Attends Engineer, Structural: More than 4 times per year    Marital Status: Married  Catering Manager  Violence: Not At Risk (07/29/2022)   Humiliation, Afraid, Rape, and Kick questionnaire    Fear of Current or Ex-Partner: No    Emotionally Abused: No    Physically Abused: No    Sexually Abused: No  Depression (PHQ2-9): Medium Risk (07/06/2023)   Depression (PHQ2-9)    PHQ-2 Score: 7  Alcohol Screen: Low Risk (07/29/2022)   Alcohol Screen    Last Alcohol Screening Score (AUDIT): 0  Housing: Medium Risk (07/29/2022)   Housing    Last Housing Risk Score: 1  Utilities: Not At Risk (07/29/2022)   AHC Utilities    Threatened with loss of utilities: No  Health Literacy: Not on file    Current Outpatient Medications on File Prior to Visit  Medication Sig Dispense  Refill   albuterol  (VENTOLIN  HFA) 108 (90 Base) MCG/ACT inhaler Inhale 1 puff into the lungs every 4 (four) hours as needed.     atorvastatin  (LIPITOR) 40 MG tablet Take 1 tablet (40 mg total) by mouth daily. 90 tablet 3   azelastine  (ASTELIN ) 0.1 % nasal spray Place 1 spray into both nostrils 2 (two) times daily. Use in each nostril as directed 30 mL 0   cetirizine  (ZYRTEC ) 5 MG tablet Take 1 tablet (5 mg total) by mouth daily. 30 tablet 2   clonazePAM (KLONOPIN) 1 MG tablet TAKE ONE TABLET BY MOUTH UP TO THREE TIMES DAILY AS NEEDED FOR ANXIETY  0   etonogestrel (IMPLANON) 68 MG IMPL implant Inject 1 each into the skin once.     fluticasone  (FLONASE ) 50 MCG/ACT nasal spray Place 1 spray into both nostrils 2 (two) times daily. 16 g 2   HYDROcodone -acetaminophen  (NORCO) 10-325 MG tablet Take 1 tablet by mouth every 6 (six) hours as needed.     meclizine  (ANTIVERT ) 25 MG tablet Take 1 tablet (25 mg total) by mouth 3 (three) times daily as needed for dizziness. 30 tablet 0   PARoxetine (PAXIL) 40 MG tablet Take 60 mg by mouth daily.     PARoxetine (PAXIL) 40 MG tablet Take 40 mg by mouth every morning.     pseudoephedrine  (SUDAFED) 60 MG tablet Take 1 tablet (60 mg total) by mouth every 8 (eight) hours as needed for congestion. 20 tablet 0   risperiDONE  (RISPERDAL ) 1 MG tablet Take 1 tablet (1 mg total) by mouth daily. 30 tablet 0   traZODone (DESYREL) 100 MG tablet Take 300 mg by mouth at bedtime.     No current facility-administered medications on file prior to visit.      HPI   [name] is a 53 y.o.-year-old female, referred by her PCP, for evaluation for multinodular goiter.  This was an incidental finding during recent exam.  A mass was noted to right neck prompting further evaluation with thyroid  ultrasound.  Thyroid  U/S: 01/19/23 CLINICAL DATA:  Neck Mass   EXAM: THYROID  ULTRASOUND   TECHNIQUE: Ultrasound examination of the thyroid  gland and adjacent soft tissues was  performed.   COMPARISON:  None available   FINDINGS: Parenchymal Echotexture: Markedly heterogeneous   Isthmus: 0.4 cm   Right lobe: 6.9 x 2.7 x 3.4 cm   Left lobe: 7.1 x 4.5 x 3.0 cm   _________________________________________________________   Estimated total number of nodules >/= 1 cm: 6   Number of spongiform nodules >/=  2 cm not described below (TR1): 0   Number of mixed cystic and solid nodules >/= 1.5 cm not described below (TR2): 0   _________________________________________________________   Nodule #  1:   Location: Right; superior   Maximum size: 2.1 cm; Other 2 dimensions: 1.8 x 2.0 cm   Composition: solid/almost completely solid (2)   Echogenicity: isoechoic (1)   Shape: not taller-than-wide (0)   Margins: smooth (0)   Echogenic foci: none (0)   ACR TI-RADS total points: 3.   ACR TI-RADS risk category: TR3 (3 points).   ACR TI-RADS recommendations:   *Given size (>/= 1.5 - 2.4 cm) and appearance, a follow-up ultrasound in 1 year should be considered based on TI-RADS criteria.   _________________________________________________________   Nodule 2: 1.2 cm predominantly cystic right mid thyroid  nodule does not meet criteria for imaging surveillance or FNA.   _________________________________________________________   Nodule # 3:   Location: Right; mid   Maximum size: 2.4 cm; Other 2 dimensions: 1.5 x 2.4 cm   Composition: solid/almost completely solid (2)   Echogenicity: isoechoic (1)   Shape: not taller-than-wide (0)   Margins: smooth (0)   Echogenic foci: none (0)   ACR TI-RADS total points: 3.   ACR TI-RADS risk category: TR3 (3 points).   ACR TI-RADS recommendations:   *Given size (>/= 1.5 - 2.4 cm) and appearance, a follow-up ultrasound in 1 year should be considered based on TI-RADS criteria.   _________________________________________________________   Nodule # 4:   Location: Right; inferior   Maximum size: 3.1 cm;  Other 2 dimensions: 2.5 x 2.3 cm   Composition: solid/almost completely solid (2)   Echogenicity: hypoechoic (2)   Shape: not taller-than-wide (0)   Margins: ill-defined (0)   Echogenic foci: none (0)   ACR TI-RADS total points: 4.   ACR TI-RADS risk category: TR4 (4-6 points).   ACR TI-RADS recommendations:   **Given size (>/= 1.5 cm) and appearance, fine needle aspiration of this moderately suspicious nodule should be considered based on TI-RADS criteria.   _________________________________________________________   Nodule 5: 2.7 x 2.4 x 2.2 cm heterogeneous region in the superior left thyroid  lobe favored to be pseudo nodule given lack of define margins.   _________________________________________________________   Nodule # 6:   Location: Left; inferior   Maximum size: 1.3 cm; Other 2 dimensions: 1.2 x 1.2 cm   Composition: solid/almost completely solid (2)   Echogenicity: hypoechoic (2)   Shape: not taller-than-wide (0)   Margins: smooth (0)   Echogenic foci: none (0)   ACR TI-RADS total points: 4.   ACR TI-RADS risk category: TR4 (4-6 points).   ACR TI-RADS recommendations:   *Given size (>/= 1 - 1.4 cm) and appearance, a follow-up ultrasound in 1 year should be considered based on TI-RADS criteria.   _________________________________________________________   IMPRESSION: 1. Multinodular goiter. 2. Nodules 4 meets criteria for FNA. 3. Nodules 1, 3, and 6 meet criteria for imaging follow-up. Annual ultrasound surveillance is recommended until 5 years of stability is documented.   The above is in keeping with the ACR TI-RADS recommendations - J Am Coll Radiol 2017;14:587-595.     Electronically Signed   By: Aliene Lloyd M.D.   On: 01/20/2023 14:08   I reviewed pt's thyroid  tests: Lab Results  Component Value Date   TSH 0.921 09/11/2024   TSH 0.683 03/05/2024   TSH 2.430 11/02/2023   TSH 0.804 06/13/2023   TSH 1.34 07/26/2022   TSH  1.01 04/25/2022   FREET4 0.93 09/11/2024   FREET4 0.92 03/05/2024   FREET4 0.93 11/02/2023   FREET4 1.18 06/13/2023     She does have family history of thyroid  problems,  sister was recently diagnosed with multiple thyroid  nodules, mom has ?hypothyroidism.  No FH of thyroid  cancer. No h/o radiation tx to head or neck.  No seaweed or kelp. No recent contrast studies. No steroid use. No herbal supplements. No Biotin supplements or Hair, Skin and Nails vitamins.  Pt also has a history of Bipolar disorder, anxiety.  Review of systems  Constitutional: + decreasing body weight-notes she has regained some since being taken off Zepbound,  current Body mass index is 46.58 kg/m. , no fatigue, + temperature fluctuations Eyes: no blurry vision, no xerophthalmia ENT: + intermittent sore throat, + nodules palpated in throat, + intermittent dysphagia/odynophagia, no hoarseness, Cardiovascular: no chest pain, no shortness of breath, no palpitations, no leg swelling Respiratory:  no shortness of breath Gastrointestinal: no nausea/vomiting/diarrhea Musculoskeletal: no muscle/joint aches Skin: no rashes, no hyperemia Neurological: + tremors (since childhood), no numbness, no tingling, no dizziness Psychiatric: no depression, + anxiety  ---------------------------------------------------------------------------------------------------------------------- Objective    BP 98/70 (BP Location: Left Arm, Patient Position: Sitting, Cuff Size: Large)   Pulse 62   Ht 5' 6 (1.676 m)   Wt 288 lb 9.6 oz (130.9 kg)   BMI 46.58 kg/m    BP Readings from Last 3 Encounters:  09/18/24 98/70  03/19/24 122/76  11/30/23 117/78    Wt Readings from Last 3 Encounters:  09/18/24 288 lb 9.6 oz (130.9 kg)  03/19/24 294 lb 9.6 oz (133.6 kg)  11/20/23 294 lb 3.2 oz (133.4 kg)      Physical Exam- Limited  Constitutional:  Body mass index is 46.58 kg/m. , not in acute distress, normal state of mind Eyes:   EOMI, no exophthalmos Musculoskeletal: no gross deformities, strength intact in all four extremities, no gross restriction of joint movements Skin:  no rashes, no hyperemia Neurological: no tremor with outstretched hands    Thyroid  US  from 12/27/23  CLINICAL DATA:  Goiter. History of right inferior thyroid  nodule previously biopsied in September of 2024. Three additional nodules (right superior, right mid and left inferior) are currently under imaging surveillance.   EXAM: THYROID  ULTRASOUND   TECHNIQUE: Ultrasound examination of the thyroid  gland and adjacent soft tissues was performed.   COMPARISON:  Prior thyroid  ultrasound 01/19/2023   FINDINGS: Parenchymal Echotexture: Markedly heterogenous   Isthmus: 0.6 cm   Right lobe: 7.2 x 3.0 x 3.1 cm   Left lobe: 6.9 x 3.2 x 3.2 cm   _________________________________________________________   Estimated total number of nodules >/= 1 cm: 5   Number of spongiform nodules >/=  2 cm not described below (TR1): 0   Number of mixed cystic and solid nodules >/= 1.5 cm not described below (TR2): 0   _________________________________________________________   Nodule # 1: Isoechoic solid nodule in the right upper gland is stable at 2.2 x 1.9 x 2.2 cm compared to 2.1 x 1.8 x 2.0 cm previously. Findings remain consistent with TI-RADS category 3. *Given size (>/= 1.5 - 2.4 cm) and appearance, a follow-up ultrasound in 1 year should be considered based on TI-RADS criteria.   Nodule # 2: Small predominantly solid isoechoic nodule in the right mid gland measures no more than 1.3 cm. TI-RADS category 3. Given size (<1.4 cm) and appearance, this nodule does NOT meet TI-RADS criteria for biopsy or dedicated follow-up.   Nodule # 3: Ill-defined solid isoechoic nodule in the medial aspect of the right mid gland remains unchanged at 2.4 by 1.9 x 2.3 cm. Findings remain consistent with TI-RADS category 3. *Given size (>/=  1.5 - 2.4 cm) and  appearance, a follow-up ultrasound in 1 year should be considered based on TI-RADS criteria.   Nodule # 4: Previously biopsied solid isoechoic nodule in the right lower gland is difficult to measure with precision due to the ill-defined nature of the lesion. It is grossly unchanged at 3.2 x 2.6 by 3.2 cm.   Nodule # 6: Ill-defined hypoechoic solid nodule in the left lower gland is unchanged at 1.3 x 1.1 x 1.1 cm. Findings are consistent with TI-RADS category 4. *Given size (>/= 1 - 1.4 cm) and appearance, a follow-up ultrasound in 1 year should be considered based on TI-RADS criteria.   IMPRESSION: 1. No significant interval change in the size or appearance of the previously biopsied nodule in the r 1. No significant interval change in the size or appearance of the previously biopsied nodule in the right lower gland. Recommend correlation with prior biopsy results. 2. Nodules # 1, 3 and 6 are all stable. This exam marks 1 year of stability. Lesions continue to meet criteria for imaging surveillance. Recommend annual follow-up ultrasound until 5 years of stability are confirmed.   The above is in keeping with the ACR TI-RADS recommendations [facility] J[facility] [place] Radiol 2017;14:587-595     Latest Reference Range & Units 07/26/22 00:00 06/13/23 13:40 11/02/23 11:04 03/05/24 09:54 09/11/24 16:04  TSH 0.450 - 4.500 uIU/mL 1.34 (E) 0.804 2.430 0.683 0.921  Triiodothyronine,Free,Serum 2.0 - 4.4 pg/mL  3.4 2.7 3.2 3.1  T4,Free(Direct) 0.82 - 1.77 ng/dL  8.81 9.06 9.07 9.06  Thyroperoxidase Ab SerPl-aCnc 0 - 34 IU/mL  11     Thyroglobulin Antibody 0.0 - 0.9 IU/mL  389.3 (H)     (H): Data is abnormally high (E): External lab result  ----------------------------------------------------------------------------------------------------------------------  ASSESSMENT / PLAN:  1. Thyroid  Nodule 2. 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  She does not need antithyroid treatment nor thyroid  hormone replacement at this time.    Her previous FNA biopsy came back favoring benignity.  She had a follow up thyroid  ultrasound showing nodules that meet criteria for surveillance with another ultrasound in 1 year, no new nodules, no significant changes.  Will plan to repeat ultrasound in March of 2026.  I have ordered this to be done, will also recheck TSH, FT4 and FT3 at that time and follow up here in office.   Follow Up Plan: Return in about 4 months (around 01/17/2025) for Thyroid  follow up, Previsit labs, thyroid  ultrasound.     I spent  36  minutes in the care of the patient today including review of labs from Thyroid  Function, CMP, and other relevant labs ; imaging/biopsy records (current and previous including abstractions from other facilities); face-to-face time discussing  her lab results and symptoms, medications doses, her options of short and long term treatment based on the latest standards of care / guidelines;   and documenting the encounter.  Heron DELENA Silvan  participated in the discussions, expressed understanding, and voiced agreement with the above plans.  All questions were answered to her satisfaction. she is encouraged to contact clinic should she have any questions or concerns prior to her return visit.    Benton Rio, [facility] [address] [place], [place] [address] Phone: [phone] Fax: [phone]

## 2024-10-24 ENCOUNTER — Telehealth: Payer: Self-pay | Admitting: *Deleted

## 2024-10-24 NOTE — Telephone Encounter (Signed)
 Patient left a message that she has a bad sore throat,cannot talk at times, hard to swallow. She states that the knot has gotten bigger. Currently she has appointment in April and a Ultrasound in March. She is asking for the U/S to be done earlier along with lab work and appointment.

## 2024-10-25 NOTE — Telephone Encounter (Signed)
 According to the notes, she has been contacted several times to set up appointment for the ultrasound but they got her VM.  She can do that anytime between now and the appointment in April.  I would recommend she call [facility] to get that set up.  Her last labs were just done a little over a month ago, so I don't expect we would see much change in that in such a short period of time.  Once I get the ultrasound report back, I can reach out with what those results show and determine if it changes our original game plan.  Right now, I do not have any earlier appointments.

## 2024-10-25 NOTE — Telephone Encounter (Signed)
 Patient was called and made aware.

## 2024-10-30 ENCOUNTER — Ambulatory Visit (HOSPITAL_COMMUNITY)
Admission: RE | Admit: 2024-10-30 | Discharge: 2024-10-30 | Disposition: A | Discharge status: Home / Self Care | Source: Ambulatory Visit | Attending: Nurse Practitioner | Admitting: Nurse Practitioner

## 2024-10-30 ENCOUNTER — Ambulatory Visit: Payer: Self-pay | Admitting: Nurse Practitioner

## 2024-10-30 DIAGNOSIS — Z8349 Family history of other endocrine, nutritional and metabolic diseases: Secondary | ICD-10-CM | POA: Insufficient documentation

## 2024-10-30 DIAGNOSIS — E042 Nontoxic multinodular goiter: Secondary | ICD-10-CM | POA: Diagnosis present

## 2024-10-30 DIAGNOSIS — R7689 Other specified abnormal immunological findings in serum: Secondary | ICD-10-CM | POA: Insufficient documentation

## 2025-01-20 ENCOUNTER — Ambulatory Visit: Admitting: Nurse Practitioner
